# Patient Record
Sex: Female | Born: 1980 | Race: Black or African American | Hispanic: No | Marital: Single | Smoking: Never smoker
Health system: Southern US, Community
[De-identification: ages and names within clinical notes are randomized; demographics above are authoritative.]

## PROBLEM LIST (undated history)

## (undated) DIAGNOSIS — E119 Type 2 diabetes mellitus without complications: Secondary | ICD-10-CM

## (undated) DIAGNOSIS — F329 Major depressive disorder, single episode, unspecified: Secondary | ICD-10-CM

## (undated) DIAGNOSIS — F32A Depression, unspecified: Secondary | ICD-10-CM

## (undated) DIAGNOSIS — I10 Essential (primary) hypertension: Secondary | ICD-10-CM

## (undated) DIAGNOSIS — B019 Varicella without complication: Secondary | ICD-10-CM

## (undated) DIAGNOSIS — D649 Anemia, unspecified: Secondary | ICD-10-CM

## (undated) HISTORY — DX: Major depressive disorder, single episode, unspecified: F32.9

## (undated) HISTORY — DX: Anemia, unspecified: D64.9

## (undated) HISTORY — DX: Varicella without complication: B01.9

## (undated) HISTORY — DX: Type 2 diabetes mellitus without complications: E11.9

## (undated) HISTORY — DX: Depression, unspecified: F32.A

## (undated) HISTORY — DX: Essential (primary) hypertension: I10

---

## 2003-09-25 HISTORY — PX: OTHER SURGICAL HISTORY: SHX169

## 2003-09-25 HISTORY — PX: CHOLECYSTECTOMY: SHX55

## 2010-08-15 ENCOUNTER — Emergency Department (HOSPITAL_COMMUNITY): Admission: EM | Admit: 2010-08-15 | Discharge: 2010-08-15 | Payer: Self-pay | Admitting: Emergency Medicine

## 2010-11-01 ENCOUNTER — Inpatient Hospital Stay (INDEPENDENT_AMBULATORY_CARE_PROVIDER_SITE_OTHER)
Admission: RE | Admit: 2010-11-01 | Discharge: 2010-11-01 | Disposition: A | Discharge status: Home / Self Care | Payer: BC Managed Care – PPO | Source: Ambulatory Visit | Attending: Family Medicine | Admitting: Family Medicine

## 2010-11-01 DIAGNOSIS — F4320 Adjustment disorder, unspecified: Secondary | ICD-10-CM

## 2010-12-05 LAB — D-DIMER, QUANTITATIVE: D-Dimer, Quant: 0.33 ug/mL-FEU (ref 0.00–0.48)

## 2010-12-05 LAB — CBC
Hemoglobin: 8.6 g/dL — ABNORMAL LOW (ref 12.0–15.0)
MCHC: 28.3 g/dL — ABNORMAL LOW (ref 30.0–36.0)
RDW: 18.8 % — ABNORMAL HIGH (ref 11.5–15.5)

## 2010-12-05 LAB — BASIC METABOLIC PANEL
Calcium: 8.7 mg/dL (ref 8.4–10.5)
GFR calc non Af Amer: >60 mL/min (ref >60)
Glucose, Bld: 155 mg/dL — ABNORMAL HIGH (ref 70–99)
Sodium: 136 mEq/L (ref 135–145)

## 2010-12-05 LAB — DIFFERENTIAL
Eosinophils Absolute: 0.2 10*3/uL (ref 0.0–0.7)
Monocytes Absolute: 0.6 10*3/uL (ref 0.1–1.0)
Neutrophils Relative %: 62 % (ref 43–77)
Smear Review: INCREASED

## 2011-03-22 ENCOUNTER — Emergency Department: Payer: Self-pay | Admitting: Emergency Medicine

## 2011-04-04 ENCOUNTER — Emergency Department: Payer: Self-pay

## 2011-05-13 ENCOUNTER — Emergency Department: Payer: Self-pay | Admitting: *Deleted

## 2011-08-26 ENCOUNTER — Encounter: Payer: Self-pay | Admitting: *Deleted

## 2011-08-26 ENCOUNTER — Emergency Department (HOSPITAL_COMMUNITY)
Admission: EM | Admit: 2011-08-26 | Discharge: 2011-08-26 | Disposition: A | Discharge status: Home / Self Care | Payer: BC Managed Care – PPO | Attending: Emergency Medicine | Admitting: Emergency Medicine

## 2011-08-26 DIAGNOSIS — M545 Low back pain, unspecified: Secondary | ICD-10-CM | POA: Insufficient documentation

## 2011-08-26 DIAGNOSIS — R109 Unspecified abdominal pain: Secondary | ICD-10-CM | POA: Insufficient documentation

## 2011-08-26 DIAGNOSIS — R079 Chest pain, unspecified: Secondary | ICD-10-CM | POA: Insufficient documentation

## 2011-08-26 DIAGNOSIS — K219 Gastro-esophageal reflux disease without esophagitis: Secondary | ICD-10-CM | POA: Insufficient documentation

## 2011-08-26 LAB — URINALYSIS, ROUTINE W REFLEX MICROSCOPIC
Bilirubin Urine: NEGATIVE
Ketones, ur: NEGATIVE mg/dL
Nitrite: NEGATIVE
Protein, ur: NEGATIVE mg/dL
Urobilinogen, UA: 0.2 mg/dL (ref 0.0–1.0)

## 2011-08-26 LAB — POCT I-STAT, CHEM 8
Creatinine, Ser: 0.7 mg/dL (ref 0.50–1.10)
Glucose, Bld: 111 mg/dL — ABNORMAL HIGH (ref 70–99)
Hemoglobin: 10.9 g/dL — ABNORMAL LOW (ref 12.0–15.0)
TCO2: 25 mmol/L (ref 0–100)

## 2011-08-26 MED ORDER — GI COCKTAIL ~~LOC~~: 30.0000 mL | Freq: Once | ORAL | Status: DC

## 2011-08-26 MED ORDER — KETOROLAC TROMETHAMINE 30 MG/ML IJ SOLN
30.0000 mg | Freq: Once | INTRAMUSCULAR | Status: AC
  Administered 2011-08-26: 30 mg via INTRAMUSCULAR
  Filled 2011-08-26: qty 1

## 2011-08-26 NOTE — ED Provider Notes (Signed)
History     CSN: [id number] Arrival date & time: 08/26/2011  5:34 AM   None     Chief Complaint  Patient presents with  . Chest Pain    Pounding pain on sternum and sharp shooting over kidneys to back.    (Consider location/radiation/quality/duration/timing/severity/associated sxs/prior treatment) HPI  History reviewed. No pertinent past medical history.  Past Surgical History  Procedure Date  . Cholecystectomy     History reviewed. No pertinent family history.  History  Substance Use Topics  . Smoking status: Not on file  . Smokeless tobacco: Not on file  . Alcohol Use:     OB History    Grav Para Term Preterm Abortions TAB SAB Ect Mult Living                  Review of Systems  Allergies  Review of patient's allergies indicates no known allergies.  Home Medications   Current Outpatient Rx  Name Route Sig Dispense Refill  . FEXOFENADINE HCL 180 MG PO TABS Oral Take 180 mg by mouth daily.      . IRON PO Oral Take 1 tablet by mouth daily.      Marland Kitchen VALACYCLOVIR HCL 500 MG PO TABS Oral Take 500 mg by mouth daily.        BP 121/71  Pulse 56  Temp(Src) 98.2 F (36.8 C) (Oral)  Resp 12  SpO2 99%  LMP 08/02/2011  Physical Exam  ED Course  Procedures (including critical care time)  Labs Reviewed - No data to display No results found.   No diagnosis found.    MDM  Not my patient.         Doug Sou, MD 08/26/11 [phone]

## 2011-08-26 NOTE — ED Provider Notes (Signed)
History     CSN: [id number] Arrival date & time: 08/26/2011  5:34 AM   First MD Initiated Contact with Patient 08/26/11 0606      Chief Complaint  Patient presents with  . Chest Pain    Pounding pain on sternum and sharp shooting over kidneys to back.    (Consider location/radiation/quality/duration/timing/severity/associated sxs/prior treatment) HPI  Patient presents to ER complaining of acute onset left lower back and flank pain stating that she attempted to go to sleep this morning at 2am when she had acute onset pain in left lower back/flank that radiated up through flank and axilla stating at the same time she began to have a "pounding" in her chest. Patient states the symptoms would "come in waves" every few minutes with recurrent pounding sensation in chest with pain but denies CP or SOB outside of pain in back/flank. Patient denies tobacco, alcohol or illicit drug use, dysuria, hematuria, abdominal pain, vaginal d/c, n/v/d. Patient denies injury to lower back or radiation of pain into lower extremities. Patient is G1P0A1 with LMP November 8th, 2012. Denies aggravating or alleviating factors of symptoms. Symptoms were acute onset, intermittent and persistent  History reviewed. No pertinent past medical history.  Past Surgical History  Procedure Date  . Cholecystectomy     History reviewed. No pertinent family history.  History  Substance Use Topics  . Smoking status: Not on file  . Smokeless tobacco: Not on file  . Alcohol Use:     OB History    Grav Para Term Preterm Abortions TAB SAB Ect Mult Living                  Review of Systems  All other systems reviewed and are negative.    Allergies  Review of patient's allergies indicates no known allergies.  Home Medications   Current Outpatient Rx  Name Route Sig Dispense Refill  . FEXOFENADINE HCL 180 MG PO TABS Oral Take 180 mg by mouth daily.      . IRON PO Oral Take 1 tablet by mouth daily.      Marland Kitchen  VALACYCLOVIR HCL 500 MG PO TABS Oral Take 500 mg by mouth daily.        BP 121/71  Pulse 56  Temp(Src) 98.2 F (36.8 C) (Oral)  Resp 12  SpO2 99%  LMP 08/02/2011  Physical Exam  Nursing note and vitals reviewed. Constitutional: She is oriented to person, place, and time. She appears well-developed and well-nourished. No distress.  HENT:  Head: Normocephalic and atraumatic.  Eyes: Conjunctivae and EOM are normal. Pupils are equal, round, and reactive to light.  Neck: Normal range of motion. Neck supple.  Cardiovascular: Normal rate, regular rhythm, normal heart sounds and intact distal pulses.  Exam reveals no gallop and no friction rub.   No murmur heard. Pulmonary/Chest: Effort normal and breath sounds normal. No respiratory distress. She has no wheezes. She has no rales. She exhibits no tenderness.  Abdominal: Soft. Bowel sounds are normal. She exhibits no distension and no mass. There is no tenderness. There is no rebound and no guarding.  Musculoskeletal: Normal range of motion. She exhibits tenderness. She exhibits no edema.       Tenderness to palpation of left lower soft tissue of lumbar paraspinal region but no spinal tenderness to palpation. No skin changes or crepitus.  Neurological: She is alert and oriented to person, place, and time.  Skin: Skin is warm and dry. No rash noted. She is not diaphoretic.  No erythema.  Psychiatric: She has a normal mood and affect.    ED Course  Procedures (including critical care time)  Labs Reviewed  POCT I-STAT, CHEM 8 - Abnormal; Notable for the following:    BUN 5 (*)    Glucose, Bld 111 (*)    Hemoglobin 10.9 (*)    HCT 32.0 (*)    All other components within normal limits  POCT PREGNANCY, URINE  URINALYSIS, ROUTINE W REFLEX MICROSCOPIC  POCT PREGNANCY, URINE  I-STAT, CHEM 8   No results found.   1. GERD (gastroesophageal reflux disease)       MDM  Patient with pain from left lower back up into the axilla and chest  not consistent with worrisome signs or symptoms of acute coronary symptoms. No acute findings on urinalysis suggests infection or kidney stone. Patient states pain is improved. Tenderness to palpation of soft tissue of back questioning musculoskeletal source of pain versus pounding in chest being acid reflux or GERD. Patient is afebrile and nontoxic-appearing. Patient is low-risk for PE and perked negative.  Medical screening examination/treatment/procedure(s) were performed by non-physician practitioner and as supervising physician I was immediately available for consultation/collaboration. Osvaldo Human, M.D.       Jenness [place], [place] 08/26/11 1251  Carleene Cooper III, MD 08/27/11 [phone]

## 2011-08-26 NOTE — ED Provider Notes (Signed)
7:40 AM  Date: 08/26/2011  Rate: 67  Rhythm: normal sinus rhythm  QRS Axis: normal  Intervals: normal PQRS:  Left atrial abnormality  ST/T Wave abnormalities: normal  Conduction Disutrbances:none  Narrative Interpretation: Borderline EKG  Old EKG Reviewed: unchanged    Sarah Cooper III, MD 08/26/11 [phone]

## 2011-08-26 NOTE — ED Notes (Signed)
Patient presents stating about 2am she started with epigastric pain that travels down to her lower abd  And then travels up to her head.    Denies N/V, diaphoresis

## 2012-07-25 ENCOUNTER — Emergency Department: Payer: Self-pay | Admitting: Emergency Medicine

## 2012-10-15 ENCOUNTER — Encounter: Payer: Self-pay | Admitting: Family Medicine

## 2012-11-22 LAB — HM DIABETES EYE EXAM

## 2012-12-16 ENCOUNTER — Encounter: Payer: Self-pay | Admitting: Family Medicine

## 2012-12-16 ENCOUNTER — Ambulatory Visit (INDEPENDENT_AMBULATORY_CARE_PROVIDER_SITE_OTHER): Payer: BC Managed Care – PPO | Admitting: Family Medicine

## 2012-12-16 VITALS — BP 128/80 | HR 70 | Temp 98.3°F | Ht 64.75 in | Wt 356.2 lb

## 2012-12-16 DIAGNOSIS — M25511 Pain in right shoulder: Secondary | ICD-10-CM | POA: Insufficient documentation

## 2012-12-16 DIAGNOSIS — M25519 Pain in unspecified shoulder: Secondary | ICD-10-CM

## 2012-12-16 DIAGNOSIS — E119 Type 2 diabetes mellitus without complications: Secondary | ICD-10-CM | POA: Insufficient documentation

## 2012-12-16 DIAGNOSIS — R7309 Other abnormal glucose: Secondary | ICD-10-CM

## 2012-12-16 DIAGNOSIS — R0602 Shortness of breath: Secondary | ICD-10-CM

## 2012-12-16 DIAGNOSIS — R7303 Prediabetes: Secondary | ICD-10-CM

## 2012-12-16 LAB — CBC WITH DIFFERENTIAL/PLATELET
Basophils Relative: 0.2 % (ref 0.0–3.0)
Eosinophils Absolute: 0.1 10*3/uL (ref 0.0–0.7)
MCHC: 31.5 g/dL (ref 30.0–36.0)
MCV: 65.8 fl — ABNORMAL LOW (ref 78.0–100.0)
Monocytes Absolute: 0.5 10*3/uL (ref 0.1–1.0)
Neutrophils Relative %: 57.7 % (ref 43.0–77.0)
Platelets: 488 10*3/uL — ABNORMAL HIGH (ref 150.0–400.0)
RBC: 4.74 Mil/uL (ref 3.87–5.11)

## 2012-12-16 LAB — BASIC METABOLIC PANEL
BUN: 9 mg/dL (ref 6–23)
Calcium: 8.8 mg/dL (ref 8.4–10.5)
GFR: 119.14 mL/min (ref >60.00)
Potassium: 3.7 mEq/L (ref 3.5–5.1)

## 2012-12-16 LAB — HEPATIC FUNCTION PANEL
AST: 12 U/L (ref 0–37)
Total Bilirubin: 0.3 mg/dL (ref 0.3–1.2)

## 2012-12-16 LAB — LIPID PANEL
Cholesterol: 88 mg/dL (ref 0–200)
HDL: 41.9 mg/dL (ref >39.00)
Triglycerides: 49 mg/dL (ref 0.0–149.0)
VLDL: 9.8 mg/dL (ref 0.0–40.0)

## 2012-12-16 LAB — TSH: TSH: 0.9 u[IU]/mL (ref 0.35–5.50)

## 2012-12-16 MED ORDER — NAPROXEN 500 MG PO TABS: 500.0000 mg | ORAL_TABLET | Freq: Two times a day (BID) | ORAL | Status: DC

## 2012-12-16 NOTE — Assessment & Plan Note (Signed)
New.  Pt aware that this is problem and w/ BMI of 59 is very serious.  Has already started to make dietary changes.  Attempting to start exercising.  Reviewed importance of easing into exercise rather than overdoing it- to prevent injury, SOB, dizziness.  Refer to nutrition.  Check labs to risk stratify.  Will follow closely.

## 2012-12-16 NOTE — Patient Instructions (Signed)
Follow up in 3 months to recheck symptoms- sooner if needed We'll notify you of your lab results and make any changes if needed Ease into exercise- goal is to go 2-5 minutes past when it gets hard.  45 minutes each day of activity is recommended but doesn't have to come at same time Congrats on the healthy food choices!!! Someone will call you with your sports med appt Start the Naproxen twice daily- take w/ food OTC Prilosec for heartburn or shortness of breath Call with any questions or concerns Welcome!  We're glad to have you!!!

## 2012-12-16 NOTE — Assessment & Plan Note (Signed)
New.  Pt w/ some rotator cuff sxs w/out obvious evidence of tear.  Will refer to sports med for complete evaluation and home exercise plan.  Start scheduled NSAIDs.  Pt expressed understanding and is in agreement w/ plan.

## 2012-12-16 NOTE — Progress Notes (Signed)
  Subjective:    Patient ID: Sarah Faulkner, female    DOB: July 27, 1981, 32 y.o.   MRN: [id number]  HPI New to establish.  Previous MD- in [place], last seen 2 yrs ago   OB- [facility]  Pre-diabetes- dx'd 2012.  Has not had labs since dx.  Has never been on medication.  No family hx of DM.  BMI 59.  Denies polyuria, polydipsia, polyphagia.  Has recently changed diet, leaning towards pescaterian.  Since changing diet will have nausea w/ fatty, high carb foods.  Has started walking- will have some dizziness w/ exertion.  R arm pain- having pain from upper arm to base of neck.  sxs started a few months ago.  Limited ROM.  R hand dominant.  Will hurt to lie on R shoulder.  sxs initially started when pt attempted to move TV by herself.  No weakness or numbness.  Pain will start once arm gets to shoulder level.  Pain with internal rotation.  No pain w/ cross body motion.  SOB- occurs intermittently, will last 1-2 days at a time and then spontaneously resolve.  Feels like a squeezing sensation under ribs.  No associated CP.  Pt feels this might be weight related.  Not positional.  No hx of asthma.    Review of Systems For ROS see HPI     Objective:   Physical Exam  Vitals reviewed. Constitutional: She is oriented to person, place, and time. She appears well-developed and well-nourished. No distress.  Morbidly obese  HENT:  Head: Normocephalic and atraumatic.  Eyes: Conjunctivae and EOM are normal. Pupils are equal, round, and reactive to light.  Neck: Normal range of motion. Neck supple. No thyromegaly present.  Cardiovascular: Normal rate, regular rhythm, normal heart sounds and intact distal pulses.   No murmur heard. Pulmonary/Chest: Effort normal and breath sounds normal. No respiratory distress.  Abdominal: Soft. She exhibits no distension. There is no tenderness.  Musculoskeletal: She exhibits no edema.  Decreased ROM of R shoulder above 75 degrees Negative impingement signs Pain w/  abduction, forward flexion, internal rotation  Lymphadenopathy:    She has no cervical adenopathy.  Neurological: She is alert and oriented to person, place, and time.  Skin: Skin is warm and dry.  Psychiatric: She has a normal mood and affect. Her behavior is normal.          Assessment & Plan:

## 2012-12-16 NOTE — Assessment & Plan Note (Signed)
New.  Likely multifactorial- body habitus, likely esophageal spasm.  Lungs CTAB.  No red flags on hx or PE.  Will continue to follow closely.  Reviewed supportive care and red flags that should prompt return.  Pt expressed understanding and is in agreement w/ plan.

## 2012-12-16 NOTE — Assessment & Plan Note (Signed)
New to provider.  Pt was dx'd w/ this previously but has not had f/u labs.  Will check labs and start meds prn.  Discussed importance of healthy diet and regular exercise.  Will follow closely.

## 2012-12-17 MED ORDER — FERROUS SULFATE 325 (65 FE) MG PO TABS: 325.0000 mg | ORAL_TABLET | Freq: Every day | ORAL | Status: DC

## 2012-12-17 MED ORDER — METFORMIN HCL 500 MG PO TABS: 500.0000 mg | ORAL_TABLET | Freq: Two times a day (BID) | ORAL | Status: DC

## 2013-01-12 ENCOUNTER — Encounter: Payer: Self-pay | Admitting: *Deleted

## 2013-01-12 ENCOUNTER — Encounter: Payer: BC Managed Care – PPO | Attending: Family Medicine | Admitting: *Deleted

## 2013-01-12 DIAGNOSIS — Z713 Dietary counseling and surveillance: Secondary | ICD-10-CM | POA: Insufficient documentation

## 2013-01-12 DIAGNOSIS — E669 Obesity, unspecified: Secondary | ICD-10-CM | POA: Insufficient documentation

## 2013-01-12 NOTE — Patient Instructions (Signed)
Goals:  1. 1 pound weight loss per week.  2. Limit carb intake to 2-3 portions at meals, 1 portion at snacks.  3. Include a good source of protein with meals (beans, fish, eggs, nuts, tofu, etc.) 4. Continue to exercise 5 days weekly (walking, aerobics classes).

## 2013-01-12 NOTE — Progress Notes (Signed)
Medical Nutrition Therapy:  Appt start time: 0800 end time:  0900.  Assessment:  Primary concern today: Obesity. Patient with BMI of 58.3, visiting today for weight loss counseling. She has made many healthy changes, including eliminating soda, eating more vegetables, and not eating past 7pm. She is also considering going vegetarian/pescatarian for health reasons. She has lost about 10 pounds over one month. She is also exercising more, going to a fitness center 2-3 days weekly (Zumba, toning classes) for about 2 hours. She is also walking more on her breaks daily. She reports that portion control is her main issue. She was also diagnosed with pre-diabetes with a HgbA1c of 7.0.   WEIGHT: 346.9 pounds  MEDICATIONS: metformin 500 mg BID   DIETARY INTAKE:   Usual eating pattern includes 3 meals and 2 snacks per day.  24-hr recall:  B ( AM): Cup of strawberries, hard boiled eggs, whole grain toast/applesauce, water/coffee (sugar and cream)  Snk ( AM): popcorn, carrots, granola bars, seseme sticks  L ( PM): Salad (tuna, dressing, croutons, bacon bits, cheese) or chicken and rice/pasta, yogurt (biggest meal), water Snk ( PM): Sometimes, same, or apple with peanut butter D ( PM): Fish, mixed vegetables, water  Snk ( PM): None, stops eating at 7 Beverages: water, coffee  Usual physical activity: Walking daily on breaks, fitness center classes 2 hours 2-3 days weekly  Estimated energy needs: 1800 calories 225 g carbohydrates 113 g protein 50 g fat  Progress Towards Goal(s):  In progress.   Nutritional Diagnosis:  [place]-3.3 Overweight/obesity As related to history of excessive energy intake and physical inactivity.  As evidenced by BMI 58.3.    Intervention:  Nutrition counseling. We discussed strategies for weight loss, including balancing nutrients (carbs, protein, fat), portion control, healthy snacks, and exercise. We also discussed carb counting to manage blood glucose.   Goals:  1. 1  pound weight loss per week.  2. Limit carb intake to 2-3 portions at meals, 1 portion at snacks.  3. Include a good source of protein with meals (beans, fish, eggs, nuts, tofu, etc.) 4. Continue to exercise 5 days weekly (walking, aerobics classes).   Handouts given during visit include:  Count your carbs booklet  Yellow portion card  Monitoring/Evaluation:  Dietary intake, exercise, and body weight in 2 month(s).

## 2013-03-16 ENCOUNTER — Encounter: Payer: BC Managed Care – PPO | Attending: Family Medicine | Admitting: *Deleted

## 2013-03-16 ENCOUNTER — Encounter: Payer: Self-pay | Admitting: *Deleted

## 2013-03-16 DIAGNOSIS — E669 Obesity, unspecified: Secondary | ICD-10-CM | POA: Insufficient documentation

## 2013-03-16 DIAGNOSIS — Z713 Dietary counseling and surveillance: Secondary | ICD-10-CM | POA: Insufficient documentation

## 2013-03-16 NOTE — Progress Notes (Signed)
Medical Nutrition Therapy:  Appt start time: 0800 end time:  0830.  Assessment:  Patient here today for a follow up visit for weight management. She has been successful with weight loss, losing 12.3 pounds in 8 weeks (1.5 pounds per week). She reports that she has decreased red meat intake, drinking mostly water, and increasing fruit and vegetable intake. She is also monitoring carb intake (2-3 servings per meal) and continuing to exercise 5 days a week. She states that she is trying to monitor portion size, but still struggles with knowing what a proper portion looks like. She eats mainly 3 meals daily, not eating past 7, but keeps fruit (plums, strawberries, blueberries), seseme sticks, or other healthy snacks around if she is hungry.   MEDICATIONS: Metformin 500 mg BID   DIETARY INTAKE:   Usual eating pattern includes 3 meals and 1 snacks per day.  24-hr recall:  B ( AM): Strawberries, hard boiled egg, whole grain toast, water or coffee  Snk ( AM): None usually (fruit, seseme sticks)  L ( PM): Salad, chicken and rice/pasta, yogurt, water Snk ( PM): None usually, see above D ( PM): Fish/chicken/ground [place], mixed vegetables/beans, rice, water Snk ( PM): None Beverages: Water, coffee  Usual physical activity: Fitness center 3 days weekly for 1-2 hours (Zumba, strength, toning class), at home, core exercises, jumping jacks, cardio video 2 days weekly for 30-45 minutes  Estimated energy needs: 1800 calories 225 g carbohydrates 113 g protein 50 g fat  Progress Towards Goal(s):  Some progress.   Nutritional Diagnosis:  -3.3 Overweight/obesity As related to history of excessive energy intake and physical inactivity.  As evidenced by BMI 56.1.    Intervention:  Nutrition counseling. Reinforced strategies for weight loss, focusing mainly on portion size. Patient praised for current efforts, and encouraged to continue with current habits.   Goals:  1. 10-15 pound weight loss in 2 months  (Goal weight: 218-223 pounds).  2. Continue to limit carb intake to 2-3 portions at meals, 1 portion at snacks (may go up to 4 servings if not snacking).  3. Continue to monitor portion size, using measuring cups/plate method.  4. Continue to exercise 5 days weekly for at least 30-60 minutes.    Monitoring/Evaluation:  Dietary intake, exercise, and body weight prn.

## 2013-03-18 ENCOUNTER — Encounter: Payer: Self-pay | Admitting: Family Medicine

## 2013-03-18 ENCOUNTER — Ambulatory Visit (INDEPENDENT_AMBULATORY_CARE_PROVIDER_SITE_OTHER): Payer: BC Managed Care – PPO | Admitting: Family Medicine

## 2013-03-18 VITALS — BP 138/68 | HR 78 | Temp 98.5°F | Ht 64.75 in | Wt 338.8 lb

## 2013-03-18 DIAGNOSIS — R7309 Other abnormal glucose: Secondary | ICD-10-CM

## 2013-03-18 DIAGNOSIS — R7303 Prediabetes: Secondary | ICD-10-CM

## 2013-03-18 MED ORDER — METFORMIN HCL 500 MG PO TABS: 500.0000 mg | ORAL_TABLET | Freq: Two times a day (BID) | ORAL | Status: DC

## 2013-03-18 NOTE — Progress Notes (Signed)
  Subjective:    Patient ID: Sarah Faulkner, female    DOB: 03-29-1981, 32 y.o.   MRN: [id number]  HPI Diabetes- new dx at last visit.  Has lost 20 lbs since last visit.  Walking regularly, avoiding late night eating, has changed diet- less carbs.  On Metformin.  No CP, SOB, HAs, visual changes, edema, N/V/D, abd pain.   Review of Systems For ROS see HPI     Objective:   Physical Exam  Vitals reviewed. Constitutional: She is oriented to person, place, and time. She appears well-developed and well-nourished. No distress.  HENT:  Head: Normocephalic and atraumatic.  Eyes: Conjunctivae and EOM are normal. Pupils are equal, round, and reactive to light.  Neck: Normal range of motion. Neck supple. No thyromegaly present.  Cardiovascular: Normal rate, regular rhythm, normal heart sounds and intact distal pulses.   No murmur heard. Pulmonary/Chest: Effort normal and breath sounds normal. No respiratory distress.  Abdominal: Soft. She exhibits no distension. There is no tenderness.  Musculoskeletal: She exhibits no edema.  Lymphadenopathy:    She has no cervical adenopathy.  Neurological: She is alert and oriented to person, place, and time.  Skin: Skin is warm and dry.  Psychiatric: She has a normal mood and affect. Her behavior is normal.          Assessment & Plan:

## 2013-03-18 NOTE — Patient Instructions (Addendum)
Schedule a follow up appt in 3-4 months to recheck diabetes We'll notify you of your lab results and make any changes if needed Keep up the good work!  You're doing great! Call with any questions or concerns Have a great summer!

## 2013-03-18 NOTE — Assessment & Plan Note (Signed)
New dx at last visit.  Tolerating metformin w/out difficulty.  Has lost 20 lbs since last OV!!  Applauded her efforts.  Check labs.  Adjust meds prn.  Will continue to follow closely.

## 2013-03-19 LAB — BASIC METABOLIC PANEL
BUN: 8 mg/dL (ref 6–23)
Calcium: 9.1 mg/dL (ref 8.4–10.5)
Creatinine, Ser: 0.8 mg/dL (ref 0.4–1.2)
GFR: 111.84 mL/min (ref >60.00)
Glucose, Bld: 129 mg/dL — ABNORMAL HIGH (ref 70–99)
Sodium: 135 mEq/L (ref 135–145)

## 2013-04-08 ENCOUNTER — Other Ambulatory Visit: Payer: Self-pay | Admitting: Family Medicine

## 2013-04-10 NOTE — Telephone Encounter (Signed)
Refill done.  

## 2013-06-25 ENCOUNTER — Ambulatory Visit (INDEPENDENT_AMBULATORY_CARE_PROVIDER_SITE_OTHER): Payer: BC Managed Care – PPO | Admitting: Family Medicine

## 2013-06-25 VITALS — BP 130/78 | HR 86 | Temp 98.1°F | Resp 16 | Wt 336.1 lb

## 2013-06-25 DIAGNOSIS — Z23 Encounter for immunization: Secondary | ICD-10-CM

## 2013-06-25 DIAGNOSIS — R6884 Jaw pain: Secondary | ICD-10-CM | POA: Insufficient documentation

## 2013-06-25 DIAGNOSIS — E119 Type 2 diabetes mellitus without complications: Secondary | ICD-10-CM

## 2013-06-25 MED ORDER — LISINOPRIL 5 MG PO TABS: 5.0000 mg | ORAL_TABLET | Freq: Every day | ORAL | Status: DC

## 2013-06-25 NOTE — Patient Instructions (Addendum)
Schedule your complete physical in 3-4 months We'll notify you of your lab results and make any changes if needed We'll call you with your diabetes nutrition appt Keep a log of your jaw symptoms- when it starts, how long it lasts, what makes it better or worse, etc Keep up the good work!  You can do this! Happy Fall!!!

## 2013-06-25 NOTE — Progress Notes (Signed)
  Subjective:    Patient ID: Sarah Faulkner, female    DOB: [birth year], 32 y.o.   MRN: [id number]  HPI DM- new dx this year.  On Metformin 500mg  BID.  Continues to lose weight- down another 2 lbs.  No N/V/D, abd pain, SOB, HAs, visual changes, numbness/tingling hands or feet.  Had dilated eye exam in March 2014.  Not on ACE or ARB.  Jaw pain- L lower jaw, seems to occur 1-2 weeks prior to menses.  Will have pain in center of back at same time.  Pain is intermittent for a 'couple of days'.  Denies grinding teeth.  No CP, SOB.   Review of Systems For ROS see HPI     Objective:   Physical Exam  Vitals reviewed. Constitutional: She is oriented to person, place, and time. She appears well-developed and well-nourished. No distress.  Morbidly obese  HENT:  Head: Normocephalic and atraumatic.  Mouth/Throat: Oropharynx is clear and moist.  No TTP over L TMJ, no clicking or popping w/ jaw movement  Eyes: Conjunctivae and EOM are normal. Pupils are equal, round, and reactive to light.  Neck: Normal range of motion. Neck supple. No thyromegaly present.  Cardiovascular: Normal rate, regular rhythm, normal heart sounds and intact distal pulses.   No murmur heard. Pulmonary/Chest: Effort normal and breath sounds normal. No respiratory distress.  Abdominal: Soft. She exhibits no distension. There is no tenderness.  Musculoskeletal: She exhibits no edema.  Lymphadenopathy:    She has no cervical adenopathy.  Neurological: She is alert and oriented to person, place, and time.  Skin: Skin is warm and dry.  Psychiatric: She has a normal mood and affect. Her behavior is normal.          Assessment & Plan:

## 2013-06-25 NOTE — Assessment & Plan Note (Signed)
New.  No obvious cause on PE.  Hx is not consistent w/ TMJ.  Pt feels sxs are related to menses.  Encouraged pt to chart sxs.  Will follow.

## 2013-06-25 NOTE — Assessment & Plan Note (Signed)
Pt tolerating metformin w/out difficulty.  Weight is stable.  Discussed ACE/ARB w/ pt and decided to start med for renal protection since pt is not planning on pregnancy.  Reviewed that if pt is considering pregnancy, she needs to stop med immediately.  UTD on eye exam.  Currently asymptomatic.  Pt wants more diabetic education- referral made.  Check labs.  Adjust meds prn

## 2013-06-26 LAB — BASIC METABOLIC PANEL
BUN: 8 mg/dL (ref 6–23)
CO2: 24 mEq/L (ref 19–32)
Calcium: 8.9 mg/dL (ref 8.4–10.5)
GFR: 118.74 mL/min (ref >60.00)
Glucose, Bld: 99 mg/dL (ref 70–99)

## 2013-07-23 ENCOUNTER — Other Ambulatory Visit: Payer: Self-pay | Admitting: Family Medicine

## 2013-07-23 NOTE — Telephone Encounter (Signed)
Med filled.  

## 2013-08-14 ENCOUNTER — Encounter: Payer: Self-pay | Admitting: Family Medicine

## 2013-09-10 ENCOUNTER — Other Ambulatory Visit: Payer: Self-pay | Admitting: Family Medicine

## 2013-09-11 NOTE — Telephone Encounter (Signed)
Last OV 07-24-13 ([facility]) Med filled 07-22-13 #60 with 0

## 2013-09-28 ENCOUNTER — Telehealth: Payer: Self-pay

## 2013-09-28 NOTE — Telephone Encounter (Addendum)
Left message for call back  identifiable  Medication and allergies:  Reviewed and updated  90 day supply/mail order: na Local pharmacy: [facility]   Immunizations due:  UTD  A/P:   No changes to FH or PSH or personal hx Pap--due--would like at the visit Flu vaccine--06/2013 Tdap--06/2011  To Discuss with Provider: Refill Valtrex

## 2013-09-29 ENCOUNTER — Other Ambulatory Visit (HOSPITAL_COMMUNITY)
Admission: RE | Admit: 2013-09-29 | Discharge: 2013-09-29 | Disposition: A | Discharge status: Home / Self Care | Payer: BC Managed Care – PPO | Source: Ambulatory Visit | Attending: Family Medicine | Admitting: Family Medicine

## 2013-09-29 ENCOUNTER — Encounter: Payer: Self-pay | Admitting: Family Medicine

## 2013-09-29 ENCOUNTER — Ambulatory Visit (INDEPENDENT_AMBULATORY_CARE_PROVIDER_SITE_OTHER): Payer: BC Managed Care – PPO | Admitting: Family Medicine

## 2013-09-29 VITALS — BP 124/82 | HR 86 | Temp 98.3°F | Ht 64.0 in | Wt 337.6 lb

## 2013-09-29 DIAGNOSIS — Z01419 Encounter for gynecological examination (general) (routine) without abnormal findings: Secondary | ICD-10-CM

## 2013-09-29 DIAGNOSIS — Z124 Encounter for screening for malignant neoplasm of cervix: Secondary | ICD-10-CM | POA: Insufficient documentation

## 2013-09-29 DIAGNOSIS — N760 Acute vaginitis: Secondary | ICD-10-CM | POA: Insufficient documentation

## 2013-09-29 DIAGNOSIS — Z113 Encounter for screening for infections with a predominantly sexual mode of transmission: Secondary | ICD-10-CM | POA: Insufficient documentation

## 2013-09-29 DIAGNOSIS — Z0000 Encounter for general adult medical examination without abnormal findings: Secondary | ICD-10-CM | POA: Insufficient documentation

## 2013-09-29 DIAGNOSIS — Z1151 Encounter for screening for human papillomavirus (HPV): Secondary | ICD-10-CM | POA: Insufficient documentation

## 2013-09-29 DIAGNOSIS — E119 Type 2 diabetes mellitus without complications: Secondary | ICD-10-CM

## 2013-09-29 DIAGNOSIS — Z20828 Contact with and (suspected) exposure to other viral communicable diseases: Secondary | ICD-10-CM

## 2013-09-29 LAB — HEMOGLOBIN A1C: Hgb A1c MFr Bld: 6.4 % (ref 4.6–6.5)

## 2013-09-29 LAB — CBC WITH DIFFERENTIAL/PLATELET
BASOS ABS: 0 10*3/uL (ref 0.0–0.1)
Basophils Relative: 0.1 % (ref 0.0–3.0)
EOS ABS: 0.1 10*3/uL (ref 0.0–0.7)
Eosinophils Relative: 1.4 % (ref 0.0–5.0)
HCT: 28.7 % — ABNORMAL LOW (ref 36.0–46.0)
Hemoglobin: 9.1 g/dL — ABNORMAL LOW (ref 12.0–15.0)
LYMPHS PCT: 26.6 % (ref 12.0–46.0)
Lymphs Abs: 2.1 10*3/uL (ref 0.7–4.0)
MCHC: 31.7 g/dL (ref 30.0–36.0)
MCV: 61.8 fl — AB (ref 78.0–100.0)
Monocytes Absolute: 0.4 10*3/uL (ref 0.1–1.0)
Monocytes Relative: 5.5 % (ref 3.0–12.0)
NEUTROS PCT: 66.4 % (ref 43.0–77.0)
Neutro Abs: 5.1 10*3/uL (ref 1.4–7.7)
PLATELETS: 470 10*3/uL — AB (ref 150.0–400.0)
RBC: 4.64 Mil/uL (ref 3.87–5.11)
RDW: 18.4 % — AB (ref 11.5–14.6)
WBC: 7.7 10*3/uL (ref 4.5–10.5)

## 2013-09-29 LAB — BASIC METABOLIC PANEL
BUN: 9 mg/dL (ref 6–23)
CHLORIDE: 103 meq/L (ref 96–112)
CO2: 24 mEq/L (ref 19–32)
CREATININE: 0.8 mg/dL (ref 0.4–1.2)
Calcium: 8.6 mg/dL (ref 8.4–10.5)
GFR: 113.16 mL/min (ref >60.00)
Glucose, Bld: 91 mg/dL (ref 70–99)
POTASSIUM: 3.8 meq/L (ref 3.5–5.1)
Sodium: 135 mEq/L (ref 135–145)

## 2013-09-29 LAB — LIPID PANEL
CHOLESTEROL: 103 mg/dL (ref 0–200)
HDL: 50.2 mg/dL (ref >39.00)
LDL Cholesterol: 45 mg/dL (ref 0–99)
Total CHOL/HDL Ratio: 2
Triglycerides: 40 mg/dL (ref 0.0–149.0)
VLDL: 8 mg/dL (ref 0.0–40.0)

## 2013-09-29 LAB — HEPATIC FUNCTION PANEL
ALT: 14 U/L (ref 0–35)
AST: 12 U/L (ref 0–37)
Albumin: 3.6 g/dL (ref 3.5–5.2)
Alkaline Phosphatase: 51 U/L (ref 39–117)
BILIRUBIN TOTAL: 0.4 mg/dL (ref 0.3–1.2)
Bilirubin, Direct: 0 mg/dL (ref 0.0–0.3)
Total Protein: 7.8 g/dL (ref 6.0–8.3)

## 2013-09-29 LAB — TSH: TSH: 1.69 u[IU]/mL (ref 0.35–5.50)

## 2013-09-29 MED ORDER — VALACYCLOVIR HCL 500 MG PO TABS: 500.0000 mg | ORAL_TABLET | Freq: Every day | ORAL | Status: DC

## 2013-09-29 NOTE — Assessment & Plan Note (Signed)
UTD on eye exam (March 2014), due for labs.  Adjust meds prn.

## 2013-09-29 NOTE — Assessment & Plan Note (Signed)
Pt's PE WNL w/ exception of obesity.  Check labs.  Anticipatory guidance provided.  

## 2013-09-29 NOTE — Patient Instructions (Signed)
Follow up in 3-4 months to recheck diabetes We'll notify you of your lab results and make any changes if needed Try and make low carb food choices and get regular exercise- you can do this! Call with any questions or concerns Happy New Year!!

## 2013-09-29 NOTE — Progress Notes (Signed)
   Subjective:    Patient ID: Sarah Faulkner, female    DOB: [birth year], 33 y.o.   MRN: [id number]  HPI CPE- due for pap.  No concerns.   Review of Systems Patient reports no vision/ hearing changes, adenopathy,fever, weight change,  persistant/recurrent hoarseness , swallowing issues, chest pain, palpitations, edema, persistant/recurrent cough, hemoptysis, dyspnea (rest/exertional/paroxysmal nocturnal), gastrointestinal bleeding (melena, rectal bleeding), abdominal pain, significant heartburn, bowel changes, GU symptoms (dysuria, hematuria, incontinence), Gyn symptoms (abnormal  bleeding, pain),  syncope, focal weakness, memory loss, numbness & tingling, skin/hair/nail changes, abnormal bruising or bleeding, anxiety, or depression.     Objective:   Physical Exam  General Appearance:    Alert, cooperative, no distress, appears stated age, morbidly obese  Head:    Normocephalic, without obvious abnormality, atraumatic  Eyes:    PERRL, conjunctiva/corneas clear, EOM's intact, fundi    benign, both eyes  Ears:    Normal TM's and external ear canals, both ears  Nose:   Nares normal, septum midline, mucosa normal, no drainage    or sinus tenderness  Throat:   Lips, mucosa, and tongue normal; teeth and gums normal  Neck:   Supple, symmetrical, trachea midline, no adenopathy;    Thyroid: no enlargement/tenderness/nodules  Back:     Symmetric, no curvature, ROM normal, no CVA tenderness  Lungs:     Clear to auscultation bilaterally, respirations unlabored  Chest Wall:    No tenderness or deformity   Heart:    Regular rate and rhythm, S1 and S2 normal, no murmur, rub   or gallop  Breast Exam:    No tenderness, masses, or nipple abnormality  Abdomen:     Soft, non-tender, bowel sounds active all four quadrants,    no masses, no organomegaly  Genitalia:    External genitalia normal, cervix normal in appearance, no CMT, uterus in normal size and position, adnexa w/out mass or tenderness,  mucosa pink and moist, no lesions or discharge present  Rectal:    Normal external appearance  Extremities:   Extremities normal, atraumatic, no cyanosis or edema  Pulses:   2+ and symmetric all extremities  Skin:   Skin color, texture, turgor normal, no rashes or lesions  Lymph nodes:   Cervical, supraclavicular, and axillary nodes normal  Neurologic:   CNII-XII intact, normal strength, sensation and reflexes    throughout          Assessment & Plan:

## 2013-09-29 NOTE — Assessment & Plan Note (Signed)
Pap collected. 

## 2013-09-30 ENCOUNTER — Telehealth: Payer: Self-pay

## 2013-09-30 LAB — HIV ANTIBODY (ROUTINE TESTING W REFLEX): HIV: NONREACTIVE

## 2013-09-30 LAB — RPR

## 2013-09-30 NOTE — Telephone Encounter (Signed)
Relevant patient education assigned to patient using Emmi. ° °

## 2013-10-01 LAB — VITAMIN D 1,25 DIHYDROXY
Vitamin D 1, 25 (OH)2 Total: 28 pg/mL (ref 18–72)
Vitamin D2 1, 25 (OH)2: <8 pg/mL
Vitamin D3 1, 25 (OH)2: 28 pg/mL

## 2013-10-02 ENCOUNTER — Other Ambulatory Visit: Payer: Self-pay | Admitting: General Practice

## 2013-10-02 MED ORDER — METRONIDAZOLE 500 MG PO TABS: 500.0000 mg | ORAL_TABLET | Freq: Two times a day (BID) | ORAL | Status: DC

## 2013-12-07 ENCOUNTER — Other Ambulatory Visit: Payer: Self-pay | Admitting: Family Medicine

## 2013-12-07 NOTE — Telephone Encounter (Signed)
Med filled.  

## 2014-01-12 ENCOUNTER — Ambulatory Visit: Payer: BC Managed Care – PPO | Admitting: Family Medicine

## 2014-01-18 ENCOUNTER — Ambulatory Visit: Payer: BC Managed Care – PPO | Admitting: Family Medicine

## 2014-02-08 ENCOUNTER — Ambulatory Visit: Payer: BC Managed Care – PPO | Admitting: Family Medicine

## 2014-03-10 ENCOUNTER — Ambulatory Visit (INDEPENDENT_AMBULATORY_CARE_PROVIDER_SITE_OTHER): Payer: BC Managed Care – PPO | Admitting: Family Medicine

## 2014-03-10 ENCOUNTER — Encounter: Payer: Self-pay | Admitting: Family Medicine

## 2014-03-10 VITALS — BP 126/78 | HR 72 | Temp 98.7°F | Resp 16 | Wt 337.0 lb

## 2014-03-10 DIAGNOSIS — E119 Type 2 diabetes mellitus without complications: Secondary | ICD-10-CM

## 2014-03-10 MED ORDER — FERROUS SULFATE 325 (65 FE) MG PO TABS: 325.0000 mg | ORAL_TABLET | Freq: Every day | ORAL | Status: DC

## 2014-03-10 NOTE — Assessment & Plan Note (Signed)
Pt tolerating meds w/o difficulty.  Attempting healthy diet and regular exercise.  Check labs.  Adjust meds prn

## 2014-03-10 NOTE — Assessment & Plan Note (Signed)
Chronic problem.  Weight is stable.  Pt frustrated that she's not losing weight.  Applauded her efforts at healthy diet and regular exercise.  Encouraged her to start using [employer] and to not give up.  Will follow closely.

## 2014-03-10 NOTE — Progress Notes (Signed)
Pre visit review using our clinic review tool, if applicable. No additional management support is needed unless otherwise documented below in the visit note. 

## 2014-03-10 NOTE — Progress Notes (Signed)
   Subjective:    Patient ID: Sarah Faulkner, female    DOB: [birth year], 33 y.o.   MRN: [id number]  HPI DM- chronic problem, on Metformin.  Taking Lisinopril when she remembers for renal protection.  Attempting to work out and 'eating better'.  Denies CP, SOB, HAs, visual changes, edema, N/V.  No symptomatic lows.  Obesity- chronic problem, pt is attempting to lose weight w/ diet and exercise.  Has seen nutrition.  Not interested in surgery.  Exercising 3x/week for 30-45 minutes.     Review of Systems For ROS see HPI     Objective:   Physical Exam  Vitals reviewed. Constitutional: She is oriented to person, place, and time. She appears well-developed and well-nourished. No distress.  Morbidly obese  HENT:  Head: Normocephalic and atraumatic.  Eyes: Conjunctivae and EOM are normal. Pupils are equal, round, and reactive to light.  Neck: Normal range of motion. Neck supple. No thyromegaly present.  Cardiovascular: Normal rate, regular rhythm, normal heart sounds and intact distal pulses.   No murmur heard. Pulmonary/Chest: Effort normal and breath sounds normal. No respiratory distress.  Abdominal: Soft. She exhibits no distension. There is no tenderness.  Musculoskeletal: She exhibits no edema.  Lymphadenopathy:    She has no cervical adenopathy.  Neurological: She is alert and oriented to person, place, and time.  Skin: Skin is warm and dry.  Psychiatric: She has a normal mood and affect. Her behavior is normal.          Assessment & Plan:

## 2014-03-10 NOTE — Patient Instructions (Signed)
Follow up in 3-4 months to recheck diabetes and weight loss progress We'll notify you of your lab results and make any changes if needed Keep up the good work on healthy diet and regular exercise Start using [employer]itness[employer] Goal is for 4-5 days of activity/week, for 30-60 minutes at a time Call with any questions or concerns Hang in there! You can do this!!!

## 2014-03-11 LAB — BASIC METABOLIC PANEL
BUN: 11 mg/dL (ref 6–23)
CALCIUM: 9.3 mg/dL (ref 8.4–10.5)
CHLORIDE: 104 meq/L (ref 96–112)
CO2: 27 meq/L (ref 19–32)
Creatinine, Ser: 0.8 mg/dL (ref 0.4–1.2)
GFR: 103.37 mL/min (ref >60.00)
GLUCOSE: 103 mg/dL — AB (ref 70–99)
POTASSIUM: 4.2 meq/L (ref 3.5–5.1)
SODIUM: 136 meq/L (ref 135–145)

## 2014-03-11 LAB — HEMOGLOBIN A1C: HEMOGLOBIN A1C: 6.5 % (ref 4.6–6.5)

## 2014-04-13 ENCOUNTER — Other Ambulatory Visit: Payer: Self-pay | Admitting: Family Medicine

## 2014-04-13 NOTE — Telephone Encounter (Signed)
Med filled.  

## 2014-04-29 ENCOUNTER — Encounter: Payer: Self-pay | Admitting: Family Medicine

## 2014-04-29 ENCOUNTER — Ambulatory Visit (INDEPENDENT_AMBULATORY_CARE_PROVIDER_SITE_OTHER): Payer: BC Managed Care – PPO | Admitting: Family Medicine

## 2014-04-29 VITALS — BP 120/74 | HR 76 | Temp 98.4°F | Resp 16 | Wt 340.4 lb

## 2014-04-29 DIAGNOSIS — IMO0001 Reserved for inherently not codable concepts without codable children: Secondary | ICD-10-CM | POA: Insufficient documentation

## 2014-04-29 DIAGNOSIS — Z30011 Encounter for initial prescription of contraceptive pills: Secondary | ICD-10-CM

## 2014-04-29 DIAGNOSIS — Z3009 Encounter for other general counseling and advice on contraception: Secondary | ICD-10-CM

## 2014-04-29 MED ORDER — NORGESTIMATE-ETH ESTRADIOL 0.25-35 MG-MCG PO TABS: 1.0000 | ORAL_TABLET | Freq: Every day | ORAL | Status: DC

## 2014-04-29 NOTE — Progress Notes (Signed)
   Subjective:    Patient ID: Sarah Faulkner, female    DOB: [birth year], 33 y.o.   MRN: [id number]  HPI Contraception- pt interested in birth control.  Previously on Ortho-Tri-Cyclen for menorrhagia.  Not currently sexually active.  Pt is more interested in the ability to control her cycle than actually preventing pregnancy.   Review of Systems For ROS see HPI     Objective:   Physical Exam  Vitals reviewed. Constitutional: She is oriented to person, place, and time. She appears well-developed and well-nourished. No distress.  Neurological: She is alert and oriented to person, place, and time.  Skin: Skin is warm and dry.  Psychiatric: She has a normal mood and affect. Her behavior is normal. Thought content normal.          Assessment & Plan:

## 2014-04-29 NOTE — Patient Instructions (Signed)
Follow up as scheduled Start the pills the Sunday after you start your cycle Take the pills daily as directed around the same time Call with any questions or concerns ENJOY YOUR TRIP!!!

## 2014-04-29 NOTE — Assessment & Plan Note (Signed)
New.  Pt interested in being able to control her periods.  Discussed various options- pt elected to start OCPs.  Reviewed appropriate start date and regular use.  Pt expressed understanding and is in agreement w/ plan.

## 2014-04-29 NOTE — Progress Notes (Signed)
Pre visit review using our clinic review tool, if applicable. No additional management support is needed unless otherwise documented below in the visit note. 

## 2014-07-12 ENCOUNTER — Ambulatory Visit: Payer: BC Managed Care – PPO | Admitting: Family Medicine

## 2014-07-29 ENCOUNTER — Ambulatory Visit (INDEPENDENT_AMBULATORY_CARE_PROVIDER_SITE_OTHER): Payer: BC Managed Care – PPO | Admitting: Family Medicine

## 2014-07-29 ENCOUNTER — Encounter: Payer: Self-pay | Admitting: Family Medicine

## 2014-07-29 ENCOUNTER — Other Ambulatory Visit: Payer: Self-pay | Admitting: Family Medicine

## 2014-07-29 VITALS — BP 130/76 | HR 108 | Temp 98.2°F | Resp 16 | Wt 336.2 lb

## 2014-07-29 DIAGNOSIS — N912 Amenorrhea, unspecified: Secondary | ICD-10-CM

## 2014-07-29 DIAGNOSIS — R110 Nausea: Secondary | ICD-10-CM | POA: Insufficient documentation

## 2014-07-29 DIAGNOSIS — E119 Type 2 diabetes mellitus without complications: Secondary | ICD-10-CM

## 2014-07-29 DIAGNOSIS — R102 Pelvic and perineal pain: Secondary | ICD-10-CM

## 2014-07-29 DIAGNOSIS — R824 Acetonuria: Secondary | ICD-10-CM

## 2014-07-29 DIAGNOSIS — R1031 Right lower quadrant pain: Secondary | ICD-10-CM

## 2014-07-29 LAB — POCT URINALYSIS DIPSTICK
BILIRUBIN UA: NEGATIVE
Blood, UA: NEGATIVE
Glucose, UA: NEGATIVE
KETONES UA: 0.5
LEUKOCYTES UA: NEGATIVE
Nitrite, UA: NEGATIVE
PROTEIN UA: 0.15
Spec Grav, UA: >=1.03
Urobilinogen, UA: >=8
pH, UA: 6

## 2014-07-29 LAB — HEPATIC FUNCTION PANEL
ALK PHOS: 53 U/L (ref 39–117)
ALT: 13 U/L (ref 0–35)
AST: 15 U/L (ref 0–37)
Albumin: 3 g/dL — ABNORMAL LOW (ref 3.5–5.2)
BILIRUBIN DIRECT: 0 mg/dL (ref 0.0–0.3)
BILIRUBIN TOTAL: 0.2 mg/dL (ref 0.2–1.2)
Total Protein: 7.8 g/dL (ref 6.0–8.3)

## 2014-07-29 LAB — CBC WITH DIFFERENTIAL/PLATELET
Basophils Absolute: 0 10*3/uL (ref 0.0–0.1)
Basophils Relative: 0.4 % (ref 0.0–3.0)
EOS ABS: 0.1 10*3/uL (ref 0.0–0.7)
EOS PCT: 0.9 % (ref 0.0–5.0)
HEMATOCRIT: 30.5 % — AB (ref 36.0–46.0)
Hemoglobin: 9.3 g/dL — ABNORMAL LOW (ref 12.0–15.0)
LYMPHS ABS: 1.8 10*3/uL (ref 0.7–4.0)
Lymphocytes Relative: 21.8 % (ref 12.0–46.0)
MCHC: 30.3 g/dL (ref 30.0–36.0)
MONO ABS: 0.6 10*3/uL (ref 0.1–1.0)
Monocytes Relative: 7.4 % (ref 3.0–12.0)
NEUTROS PCT: 69.5 % (ref 43.0–77.0)
Neutro Abs: 5.6 10*3/uL (ref 1.4–7.7)
PLATELETS: 457 10*3/uL — AB (ref 150.0–400.0)
RBC: 4.76 Mil/uL (ref 3.87–5.11)
RDW: 20.8 % — AB (ref 11.5–15.5)
WBC: 8.1 10*3/uL (ref 4.0–10.5)

## 2014-07-29 LAB — HCG, QUANTITATIVE, PREGNANCY: QUANTITATIVE HCG: 1.06 m[IU]/mL

## 2014-07-29 LAB — BASIC METABOLIC PANEL
BUN: 9 mg/dL (ref 6–23)
CO2: 22 mEq/L (ref 19–32)
Calcium: 9 mg/dL (ref 8.4–10.5)
Chloride: 106 mEq/L (ref 96–112)
Creatinine, Ser: 0.8 mg/dL (ref 0.4–1.2)
GFR: 112.58 mL/min (ref >60.00)
Glucose, Bld: 101 mg/dL — ABNORMAL HIGH (ref 70–99)
Potassium: 3.8 mEq/L (ref 3.5–5.1)
SODIUM: 138 meq/L (ref 135–145)

## 2014-07-29 LAB — HEMOGLOBIN A1C: HEMOGLOBIN A1C: 6.2 % (ref 4.6–6.5)

## 2014-07-29 LAB — POCT URINE PREGNANCY: PREG TEST UR: NEGATIVE

## 2014-07-29 MED ORDER — ONDANSETRON HCL 4 MG PO TABS: 4.0000 mg | ORAL_TABLET | Freq: Three times a day (TID) | ORAL | Status: DC | PRN

## 2014-07-29 NOTE — Assessment & Plan Note (Signed)
New.  R sided pelvic pain.  No rebound or guarding.  Despite negative u preg, get serum HCG to r/o pregnancy.  Get [place] to assess for ovarian cyst or other abnormality.  Reviewed supportive care and red flags that should prompt return.  Pt expressed understanding and is in agreement w/ plan.

## 2014-07-29 NOTE — Progress Notes (Signed)
Pre visit review using our clinic review tool, if applicable. No additional management support is needed unless otherwise documented below in the visit note. 

## 2014-07-29 NOTE — Patient Instructions (Signed)
Follow up as needed We'll notify you of your lab results and get your appt for the ultrasound Drink plenty of fluids Zofran as needed for nausea REST! Call with any questions or concerns Hang in there!!

## 2014-07-29 NOTE — Progress Notes (Signed)
   Subjective:    Patient ID: Sarah Faulkner, female    DOB: 1980/11/15, 33 y.o.   MRN: [id number]  HPI Nausea- sxs started 2 weeks ago.  Pt reports certain foods are 'making me ill'.  Having R pelvic pain that will radiate to back.  No breast tenderness.  LMP- 10/16, 'but it wasn't a normal one.  It was shorter and lighter'.  Taking OCPs.  No change in medications.  Pt had unprotected sex.  DM- ongoing issue for pt, on Metformin.  + nausea but this just started recently.  Prior to 2 weeks, pt was doing fine w/ metformin.  Has lost 4 lbs!  On Lisinopril.  No CP, SOB, HAs, visual changes, edema.   Review of Systems For ROS see HPI     Objective:   Physical Exam  Constitutional: She is oriented to person, place, and time. She appears well-developed and well-nourished. No distress.  HENT:  Head: Normocephalic and atraumatic.  Eyes: Conjunctivae and EOM are normal. Pupils are equal, round, and reactive to light.  Neck: Normal range of motion. Neck supple. No thyromegaly present.  Cardiovascular: Normal rate, regular rhythm, normal heart sounds and intact distal pulses.   No murmur heard. Pulmonary/Chest: Effort normal and breath sounds normal. No respiratory distress.  Abdominal: Soft. She exhibits no distension. There is tenderness (RLQ/pelvic pain). There is no rebound and no guarding.  Musculoskeletal: She exhibits no edema.  Lymphadenopathy:    She has no cervical adenopathy.  Neurological: She is alert and oriented to person, place, and time.  Skin: Skin is warm and dry.  Psychiatric: She has a normal mood and affect. Her behavior is normal.  Vitals reviewed.         Assessment & Plan:

## 2014-07-29 NOTE — Assessment & Plan Note (Signed)
New.  Check labs to r/o infxn, pregnancy.  Start zofran prn.  Reviewed supportive care and red flags that should prompt return.  Pt expressed understanding and is in agreement w/ plan.

## 2014-07-29 NOTE — Assessment & Plan Note (Signed)
Chronic problem.  Was previously tolerating metformin w/o difficulty but now w/ nausea- unclear if this is related to medication or other underlying process.  On ACE for renal protection.  Check labs.  Adjust meds prn

## 2014-07-30 ENCOUNTER — Ambulatory Visit (HOSPITAL_BASED_OUTPATIENT_CLINIC_OR_DEPARTMENT_OTHER)
Admission: RE | Admit: 2014-07-30 | Discharge: 2014-07-30 | Disposition: A | Discharge status: Home / Self Care | Payer: BC Managed Care – PPO | Source: Ambulatory Visit | Attending: Family Medicine | Admitting: Family Medicine

## 2014-07-30 ENCOUNTER — Other Ambulatory Visit: Payer: Self-pay | Admitting: Family Medicine

## 2014-07-30 ENCOUNTER — Ambulatory Visit (HOSPITAL_BASED_OUTPATIENT_CLINIC_OR_DEPARTMENT_OTHER): Payer: BC Managed Care – PPO

## 2014-07-30 DIAGNOSIS — R1900 Intra-abdominal and pelvic swelling, mass and lump, unspecified site: Secondary | ICD-10-CM | POA: Diagnosis not present

## 2014-07-30 DIAGNOSIS — D259 Leiomyoma of uterus, unspecified: Secondary | ICD-10-CM

## 2014-07-30 DIAGNOSIS — R102 Pelvic and perineal pain: Secondary | ICD-10-CM | POA: Diagnosis present

## 2014-07-31 LAB — GC/CHLAMYDIA PROBE AMP, URINE
Chlamydia, Swab/Urine, PCR: NEGATIVE
GC Probe Amp, Urine: NEGATIVE

## 2014-08-01 LAB — URINE CULTURE: Colony Count: 2000

## 2014-08-02 ENCOUNTER — Ambulatory Visit (HOSPITAL_BASED_OUTPATIENT_CLINIC_OR_DEPARTMENT_OTHER): Payer: BC Managed Care – PPO

## 2014-08-02 ENCOUNTER — Ambulatory Visit: Payer: BC Managed Care – PPO | Admitting: Family Medicine

## 2014-08-14 ENCOUNTER — Other Ambulatory Visit: Payer: Self-pay | Admitting: Family Medicine

## 2014-08-16 ENCOUNTER — Ambulatory Visit: Payer: Self-pay | Admitting: Gynecology

## 2014-08-16 NOTE — Telephone Encounter (Signed)
Med filled.  

## 2014-11-06 ENCOUNTER — Other Ambulatory Visit: Payer: Self-pay | Admitting: Family Medicine

## 2014-11-08 NOTE — Telephone Encounter (Signed)
Med filled.  

## 2014-12-14 ENCOUNTER — Other Ambulatory Visit: Payer: Self-pay | Admitting: Family Medicine

## 2014-12-15 NOTE — Telephone Encounter (Signed)
Med filled.  

## 2015-04-12 ENCOUNTER — Other Ambulatory Visit: Payer: Self-pay | Admitting: Family Medicine

## 2015-04-12 NOTE — Telephone Encounter (Signed)
Med filled #30 only, pt is overdue for a diabetes follow up.

## 2015-04-27 ENCOUNTER — Other Ambulatory Visit: Payer: Self-pay | Admitting: Family Medicine

## 2015-04-27 NOTE — Telephone Encounter (Signed)
Medication filled to pharmacy as requested.   

## 2015-06-22 ENCOUNTER — Other Ambulatory Visit: Payer: Self-pay | Admitting: Family Medicine

## 2015-06-22 NOTE — Telephone Encounter (Signed)
Med filled #30 . No further refills without an appt.

## 2015-07-15 ENCOUNTER — Emergency Department (HOSPITAL_COMMUNITY)
Admission: EM | Admit: 2015-07-15 | Discharge: 2015-07-15 | Disposition: A | Discharge status: Home / Self Care | Payer: BLUE CROSS/BLUE SHIELD | Attending: Emergency Medicine | Admitting: Emergency Medicine

## 2015-07-15 ENCOUNTER — Ambulatory Visit (INDEPENDENT_AMBULATORY_CARE_PROVIDER_SITE_OTHER): Payer: BLUE CROSS/BLUE SHIELD | Admitting: Family Medicine

## 2015-07-15 ENCOUNTER — Encounter: Payer: Self-pay | Admitting: Family Medicine

## 2015-07-15 ENCOUNTER — Encounter (HOSPITAL_COMMUNITY): Payer: Self-pay | Admitting: *Deleted

## 2015-07-15 VITALS — BP 130/80 | HR 89 | Temp 98.1°F | Resp 16 | Ht 64.0 in | Wt 374.4 lb

## 2015-07-15 DIAGNOSIS — Z79899 Other long term (current) drug therapy: Secondary | ICD-10-CM | POA: Insufficient documentation

## 2015-07-15 DIAGNOSIS — M5410 Radiculopathy, site unspecified: Secondary | ICD-10-CM

## 2015-07-15 DIAGNOSIS — Z8659 Personal history of other mental and behavioral disorders: Secondary | ICD-10-CM | POA: Insufficient documentation

## 2015-07-15 DIAGNOSIS — Z23 Encounter for immunization: Secondary | ICD-10-CM | POA: Diagnosis not present

## 2015-07-15 DIAGNOSIS — Y9389 Activity, other specified: Secondary | ICD-10-CM | POA: Diagnosis not present

## 2015-07-15 DIAGNOSIS — Y998 Other external cause status: Secondary | ICD-10-CM | POA: Diagnosis not present

## 2015-07-15 DIAGNOSIS — E119 Type 2 diabetes mellitus without complications: Secondary | ICD-10-CM | POA: Insufficient documentation

## 2015-07-15 DIAGNOSIS — W1839XA Other fall on same level, initial encounter: Secondary | ICD-10-CM | POA: Diagnosis not present

## 2015-07-15 DIAGNOSIS — Z8619 Personal history of other infectious and parasitic diseases: Secondary | ICD-10-CM | POA: Insufficient documentation

## 2015-07-15 DIAGNOSIS — Y92009 Unspecified place in unspecified non-institutional (private) residence as the place of occurrence of the external cause: Secondary | ICD-10-CM | POA: Insufficient documentation

## 2015-07-15 DIAGNOSIS — S3992XA Unspecified injury of lower back, initial encounter: Secondary | ICD-10-CM | POA: Insufficient documentation

## 2015-07-15 DIAGNOSIS — M5442 Lumbago with sciatica, left side: Secondary | ICD-10-CM

## 2015-07-15 LAB — BASIC METABOLIC PANEL
BUN: 8 mg/dL (ref 7–25)
CHLORIDE: 104 mmol/L (ref 98–110)
CO2: 25 mmol/L (ref 20–31)
Calcium: 8.9 mg/dL (ref 8.6–10.2)
Creat: 0.65 mg/dL (ref 0.50–1.10)
GLUCOSE: 116 mg/dL — AB (ref 65–99)
POTASSIUM: 4.6 mmol/L (ref 3.5–5.3)
Sodium: 137 mmol/L (ref 135–146)

## 2015-07-15 LAB — CBC WITH DIFFERENTIAL/PLATELET
Basophils Absolute: 0 10*3/uL (ref 0.0–0.1)
Basophils Relative: 0 % (ref 0–1)
Eosinophils Absolute: 0.1 10*3/uL (ref 0.0–0.7)
Eosinophils Relative: 1 % (ref 0–5)
HCT: 26.6 % — ABNORMAL LOW (ref 36.0–46.0)
Hemoglobin: 7.9 g/dL — ABNORMAL LOW (ref 12.0–15.0)
Lymphocytes Relative: 29 % (ref 12–46)
Lymphs Abs: 2.5 10*3/uL (ref 0.7–4.0)
MCH: 17.8 pg — ABNORMAL LOW (ref 26.0–34.0)
MCHC: 29.7 g/dL — ABNORMAL LOW (ref 30.0–36.0)
MCV: 60 fL — ABNORMAL LOW (ref 78.0–100.0)
MPV: 8.5 fL — ABNORMAL LOW (ref 8.6–12.4)
Monocytes Absolute: 0.4 10*3/uL (ref 0.1–1.0)
Monocytes Relative: 5 % (ref 3–12)
Neutro Abs: 5.6 10*3/uL (ref 1.7–7.7)
Neutrophils Relative %: 65 % (ref 43–77)
Platelets: 657 10*3/uL — ABNORMAL HIGH (ref 150–400)
RBC: 4.43 MIL/uL (ref 3.87–5.11)
RDW: 17.9 % — ABNORMAL HIGH (ref 11.5–15.5)
WBC: 8.6 10*3/uL (ref 4.0–10.5)

## 2015-07-15 LAB — HEPATIC FUNCTION PANEL
ALT: 12 U/L (ref 6–29)
AST: 9 U/L — ABNORMAL LOW (ref 10–30)
Albumin: 3.4 g/dL — ABNORMAL LOW (ref 3.6–5.1)
Alkaline Phosphatase: 56 U/L (ref 33–115)
Bilirubin, Direct: 0.1 mg/dL (ref <=0.2)
Indirect Bilirubin: 0.3 mg/dL (ref 0.2–1.2)
Total Bilirubin: 0.4 mg/dL (ref 0.2–1.2)
Total Protein: 7.5 g/dL (ref 6.1–8.1)

## 2015-07-15 LAB — LIPID PANEL
CHOLESTEROL: 80 mg/dL — AB (ref 125–200)
HDL: 36 mg/dL — ABNORMAL LOW (ref >=46)
LDL Cholesterol: 38 mg/dL (ref <130)
Total CHOL/HDL Ratio: 2.2 Ratio (ref <=5.0)
Triglycerides: 30 mg/dL (ref <150)
VLDL: 6 mg/dL (ref <30)

## 2015-07-15 LAB — HEMOGLOBIN A1C
Hgb A1c MFr Bld: 7.6 % — ABNORMAL HIGH (ref <5.7)
MEAN PLASMA GLUCOSE: 171 mg/dL — AB (ref <117)

## 2015-07-15 LAB — TSH: TSH: 1.727 u[IU]/mL (ref 0.350–4.500)

## 2015-07-15 MED ORDER — HYDROCODONE-ACETAMINOPHEN 5-325 MG PO TABS
1.0000 | ORAL_TABLET | ORAL | Status: DC | PRN
Start: 2015-07-15 — End: 2016-09-03

## 2015-07-15 MED ORDER — NAPROXEN 500 MG PO TABS: 500.0000 mg | ORAL_TABLET | Freq: Two times a day (BID) | ORAL | Status: DC

## 2015-07-15 MED ORDER — CYCLOBENZAPRINE HCL 10 MG PO TABS: 10.0000 mg | ORAL_TABLET | Freq: Three times a day (TID) | ORAL | Status: DC | PRN

## 2015-07-15 NOTE — Assessment & Plan Note (Signed)
Chronic problem.  Pt has not been compliant in her follow up.  Taking metformin w/o difficulty.  Pt has gained 40 lbs since last visit.  Stressed need for healthy diet and regular exercise.  Due for eye exam- pt plans to schedule.  On ACE for renal protection.  Check labs.  Adjust meds prn

## 2015-07-15 NOTE — Progress Notes (Signed)
   Subjective:    Patient ID: Sarah Faulkner, female    DOB: 1981-09-10, 34 y.o.   MRN: [id number]  HPI DM- chronic problem, on Metformin twice daily but 'i haven't been taking it regularly'.  On ACE for renal protection.  Due for eye exam.  No numbness/tingling of hands/feet.  No CP, SOB, HAs, visual changes, edema, symptomatic lows, abd pain, N/V.  Morbid obesity- pt has gained 40 lbs in last year.  Not exercising regularly or following particular diet.    Back pain- pt went to ER earlier today for back pain.  Pt states that she slept on floor 2 nights ago and had some pain and stiffness.  This AM, she was getting dressed and 'back went out' and she was unable to get up w/o falling.  Had to call 911 and she went to ER for evaluation and tx.  Pt is now able to walk- 'very very gingerly'.  Pain is radiating down L leg.  Pt was started on prescription strength NSAIDs and hydrocodone.   Review of Systems For ROS see HPI     Objective:   Physical Exam  Constitutional: She is oriented to person, place, and time. She appears well-developed and well-nourished. No distress.  Morbidly obese  HENT:  Head: Normocephalic and atraumatic.  Eyes: Conjunctivae and EOM are normal. Pupils are equal, round, and reactive to light.  Neck: Normal range of motion. Neck supple. No thyromegaly present.  Cardiovascular: Normal rate, regular rhythm, normal heart sounds and intact distal pulses.   No murmur heard. Pulmonary/Chest: Effort normal and breath sounds normal. No respiratory distress.  Abdominal: Soft. She exhibits no distension. There is no tenderness.  Musculoskeletal: She exhibits no edema.  + SLR on L  Lymphadenopathy:    She has no cervical adenopathy.  Neurological: She is alert and oriented to person, place, and time. No cranial nerve deficit.  Skin: Skin is warm and dry.  Psychiatric: She has a normal mood and affect. Her behavior is normal.  Vitals reviewed.         Assessment &  Plan:

## 2015-07-15 NOTE — ED Provider Notes (Signed)
CSN: [id number]     Arrival date & time 07/15/15  0913 History   First MD Initiated Contact with Patient 07/15/15 [phone]     Chief Complaint  Patient presents with  . Back Pain  . Fall     (Consider location/radiation/quality/duration/timing/severity/associated sxs/prior Treatment) Patient is a 34 y.o. female presenting with back pain and fall.  Back Pain Location:  Gluteal region Quality:  Shooting and stabbing Radiates to:  L posterior upper leg and L foot Pain severity:  Moderate Pain is:  Unable to specify Onset quality:  Gradual Duration:  1 day Timing:  Intermittent Progression:  Improving Chronicity:  New Context comment:  Laying in floor of apartment 2 nights ago Relieved by: fentanyl. Worsened by:  Nothing tried Associated symptoms: no abdominal pain, no bladder incontinence, no bowel incontinence and no fever   Risk factors: obesity   Fall Pertinent negatives include no abdominal pain.    Past Medical History  Diagnosis Date  . Chicken pox   . Depression   . Diabetes mellitus without complication [facility])    Past Surgical History  Procedure Laterality Date  . Cholecystectomy    . Gallbladder removed  2005   Family History  Problem Relation Age of Onset  . Cancer Mother     breast  . Heart disease Maternal Grandmother   . Stroke Maternal Grandmother   . Hypertension Maternal Grandmother    Social History  Substance Use Topics  . Smoking status: Never Smoker   . Smokeless tobacco: Never Used  . Alcohol Use: No   OB History    No data available     Review of Systems  Constitutional: Negative for fever.  Gastrointestinal: Negative for abdominal pain and bowel incontinence.  Genitourinary: Negative for bladder incontinence.  Musculoskeletal: Positive for back pain.  All other systems reviewed and are negative.     Allergies  Review of patient's allergies indicates no known allergies.  Home Medications   Prior to Admission medications    Medication Sig Start Date End Date Taking? Authorizing Provider  Cholecalciferol (D3-1000) 1000 UNITS tablet Take 2,000 Units by mouth daily.   Yes Historical Provider, MD  ferrous sulfate 325 (65 FE) MG tablet Take 1 tablet (325 mg total) by mouth daily with breakfast. 03/10/14  Yes Midge Minium, MD  lisinopril (PRINIVIL,ZESTRIL) 5 MG tablet TAKE 1 TABLET BY MOUTH EVERY DAY 06/22/15  Yes Midge Minium, MD  metFORMIN (GLUCOPHAGE) 500 MG tablet TAKE 1 TABLET BY MOUTH TWICE DAILY WITH MEALS 04/27/15  Yes Midge Minium, MD  valACYclovir (VALTREX) 500 MG tablet TAKE 1 TABLET BY MOUTH EVERY DAY Patient taking differently: TAKE 1 TABLET BY MOUTH EVERY DAY as needed for flare ups 12/15/14  Yes Midge Minium, MD  MONONESSA 0.25-35 MG-MCG tablet TAKE 1 TABLET BY MOUTH EVERY DAY Patient not taking: Reported on 07/15/2015 04/12/15   Midge Minium, MD   BP 169/88 mmHg  Pulse 102  Temp(Src) 97.8 F (36.6 C) (Oral)  Resp 20  SpO2 98%  LMP 07/07/2015 Physical Exam  Constitutional: She is oriented to person, place, and time. She appears well-developed and well-nourished. No distress.  HENT:  Head: Normocephalic.  Eyes: Conjunctivae are normal.  Neck: Neck supple. No tracheal deviation present.  Cardiovascular: Normal rate, regular rhythm and normal heart sounds.   Pulmonary/Chest: Effort normal and breath sounds normal. No respiratory distress.  Abdominal: Soft. She exhibits no distension.  Musculoskeletal:  Positive left leg raise  Neurological: She is  alert and oriented to person, place, and time.  Skin: Skin is warm and dry.  Psychiatric: She has a normal mood and affect.    ED Course  Procedures (including critical care time) Labs Review Labs Reviewed - No data to display  Imaging Review No results found. I have personally reviewed and evaluated these images and lab results as part of my medical decision-making.   EKG Interpretation None      MDM   Final  diagnoses:  Left-sided low back pain with left-sided sciatica    34 year old female presents with typical sciatica symptoms on the left side after sleeping on the floor 2 nights previously. Today her pain was so great that it prevented her from ambulating. She was given 200 g of fentanyl in route with improvement of her symptoms. No red flag symptoms of fever, weight loss, saddle anesthesia, weakness fecal/urinary incontinence or urinary retention. Short-term narcotic analgesia supplied and recommended scheduled NSAIDs and early mobility as definitive management. Do not feel that steroid course is indicated secondary to underlying diabetes. Plan to follow up with PCP as needed and return precautions discussed for worsening or new concerning symptoms.     Leo Grosser, MD 07/16/15 [phone]

## 2015-07-15 NOTE — Patient Instructions (Signed)
Follow up in 3 months to recheck diabetes and weight loss progress We'll notify you of your lab results and make any changes if needed Try and work on healthy diet and regular exercise- you can do it! Schedule your eye exam! Add the flexeril as needed for back pain/spasm- will cause drowsiness so best before bed Call with any questions or concerns If you want to join [place] at the new [place] office, any scheduled appointments will automatically transfer and we will see you at [address] [place] [address] [place], [place],  [address]  Hang in there!!!

## 2015-07-15 NOTE — Assessment & Plan Note (Signed)
Chronic problem for pt.  She has gained 40 lbs in the last year.  Pt is not exercising nor following a low carb diet- stressed the need for both.  Check labs to risk stratify.  Will follow closely.

## 2015-07-15 NOTE — Discharge Instructions (Signed)

## 2015-07-15 NOTE — Progress Notes (Signed)
Pre visit review using our clinic review tool, if applicable. No additional management support is needed unless otherwise documented below in the visit note. 

## 2015-07-15 NOTE — ED Notes (Signed)
Patient is complaining of back pain this morning that shoots down the left leg. Patient fell at home this morning due to this pain. She states her leg gave out. She crawled to let EMS in this morning. She was given 267mcg of fentanly IV by EMS.

## 2015-07-15 NOTE — Assessment & Plan Note (Signed)
New.  Pt was seen in ER earlier today and given scheduled NSAIDs, pain meds.  Will add Flexeril for relief before bed.  Reviewed supportive care and red flags that should prompt return.  Pt expressed understanding and is in agreement w/ plan.

## 2015-07-15 NOTE — ED Notes (Signed)
Bed: AI90 Expected date:  Expected time:  Means of arrival:  Comments: EMS F sciatica

## 2015-07-18 ENCOUNTER — Telehealth: Payer: Self-pay | Admitting: General Practice

## 2015-07-18 NOTE — Telephone Encounter (Signed)
FYI, pt has appt with [facility] on 10/27 is that ok?

## 2015-07-18 NOTE — Telephone Encounter (Signed)
That should be fine as she is asymptomatic.

## 2015-07-18 NOTE — Telephone Encounter (Signed)
-----   Message from Oneta Rack sent at 07/18/2015  1:31 PM EDT ----- Regarding: lab results  Contact: [phone] Menses have been abnormal and no blood in stool or urine, patient scheduled for 07/21/15 with Dr. Birdie Riddle. best [phone]

## 2015-07-19 ENCOUNTER — Other Ambulatory Visit: Payer: Self-pay | Admitting: General Practice

## 2015-07-19 DIAGNOSIS — E119 Type 2 diabetes mellitus without complications: Secondary | ICD-10-CM

## 2015-07-19 DIAGNOSIS — D649 Anemia, unspecified: Secondary | ICD-10-CM

## 2015-07-19 MED ORDER — METFORMIN HCL 1000 MG PO TABS: 1000.0000 mg | ORAL_TABLET | Freq: Two times a day (BID) | ORAL | Status: DC

## 2015-07-21 ENCOUNTER — Ambulatory Visit (INDEPENDENT_AMBULATORY_CARE_PROVIDER_SITE_OTHER): Payer: BLUE CROSS/BLUE SHIELD | Admitting: Family Medicine

## 2015-07-21 ENCOUNTER — Encounter: Payer: Self-pay | Admitting: Family Medicine

## 2015-07-21 VITALS — BP 126/82 | HR 76 | Temp 98.1°F | Resp 16 | Ht 64.0 in | Wt 380.1 lb

## 2015-07-21 DIAGNOSIS — N920 Excessive and frequent menstruation with regular cycle: Secondary | ICD-10-CM | POA: Diagnosis not present

## 2015-07-21 DIAGNOSIS — D509 Iron deficiency anemia, unspecified: Secondary | ICD-10-CM | POA: Diagnosis not present

## 2015-07-21 MED ORDER — FERROUS SULFATE 325 (65 FE) MG PO TABS: 325.0000 mg | ORAL_TABLET | Freq: Every day | ORAL | Status: DC

## 2015-07-21 NOTE — Patient Instructions (Signed)
Follow up as scheduled Start the daily iron supplement- add an over the counter stool softener Drink plenty of fluids We'll call you with your GYN appt Call with any questions or concerns Hang in there!!!

## 2015-07-21 NOTE — Progress Notes (Signed)
Pre visit review using our clinic review tool, if applicable. No additional management support is needed unless otherwise documented below in the visit note. 

## 2015-07-21 NOTE — Progress Notes (Signed)
   Subjective:    Patient ID: Sarah Faulkner, female    DOB: 17-Oct-1980, 34 y.o.   MRN: [id number]  HPI Anemia- pt has hx of anemia.  No hx of transfusion.  Previously on iron supplements but not recently.  Pt reports very heavy periods x2-3 days, 'out of control heavy'.  hgb 7.6 on recent labs.  No hx of GI bleeding.  No recent GYN visit- prefers female provider.  Labs done last week were right after pt finished her cycle   Review of Systems For ROS see HPI     Objective:   Physical Exam  Constitutional: She is oriented to person, place, and time. She appears well-developed and well-nourished. No distress.  Morbidly obese  HENT:  Head: Normocephalic and atraumatic.  Eyes: EOM are normal. Pupils are equal, round, and reactive to light.  Pale conjunctiva  Cardiovascular: Normal rate, regular rhythm and normal heart sounds.   Pulmonary/Chest: Effort normal and breath sounds normal. No respiratory distress. She has no wheezes. She has no rales.  Neurological: She is alert and oriented to person, place, and time.  Skin: Skin is warm and dry.  Psychiatric: She has a normal mood and affect. Her behavior is normal. Thought content normal.  Vitals reviewed.         Assessment & Plan:

## 2015-07-24 NOTE — Assessment & Plan Note (Signed)
New.  Pt reports very heavy menstrual periods.  Has not had recent GYN evaluation.  Referral placed.

## 2015-07-24 NOTE — Assessment & Plan Note (Signed)
Deteriorated based on recent labs.  Restart iron supplement.  Refer to GYN for evaluation and tx of menorrhagia.  Pt expressed understanding and is in agreement w/ plan.

## 2015-08-11 LAB — HM DIABETES EYE EXAM

## 2015-09-06 ENCOUNTER — Encounter: Payer: Self-pay | Admitting: Family Medicine

## 2015-09-08 ENCOUNTER — Encounter: Payer: Self-pay | Admitting: General Practice

## 2015-10-14 ENCOUNTER — Ambulatory Visit: Payer: BLUE CROSS/BLUE SHIELD | Admitting: Family Medicine

## 2016-01-16 ENCOUNTER — Encounter: Payer: BLUE CROSS/BLUE SHIELD | Admitting: Family Medicine

## 2016-01-17 ENCOUNTER — Telehealth: Payer: Self-pay | Admitting: Family Medicine

## 2016-01-17 NOTE — Telephone Encounter (Signed)
No charge. 

## 2016-01-17 NOTE — Telephone Encounter (Signed)
Pt was no show 01/16/16 1:30pm, per Meredith Mody pt came to [facility] office, she is rescheduled for June, charge or no charge?

## 2016-02-08 ENCOUNTER — Other Ambulatory Visit: Payer: Self-pay | Admitting: Family Medicine

## 2016-02-08 NOTE — Telephone Encounter (Signed)
Med filled, pt has a CPE next month.

## 2016-03-22 ENCOUNTER — Other Ambulatory Visit (INDEPENDENT_AMBULATORY_CARE_PROVIDER_SITE_OTHER): Payer: BLUE CROSS/BLUE SHIELD

## 2016-03-22 ENCOUNTER — Encounter: Payer: Self-pay | Admitting: Family Medicine

## 2016-03-22 ENCOUNTER — Ambulatory Visit (INDEPENDENT_AMBULATORY_CARE_PROVIDER_SITE_OTHER): Payer: BLUE CROSS/BLUE SHIELD | Admitting: Family Medicine

## 2016-03-22 ENCOUNTER — Other Ambulatory Visit: Payer: Self-pay | Admitting: General Practice

## 2016-03-22 VITALS — BP 126/84 | HR 86 | Temp 98.3°F | Resp 16 | Ht 64.0 in | Wt 372.0 lb

## 2016-03-22 DIAGNOSIS — E119 Type 2 diabetes mellitus without complications: Secondary | ICD-10-CM

## 2016-03-22 DIAGNOSIS — Z202 Contact with and (suspected) exposure to infections with a predominantly sexual mode of transmission: Secondary | ICD-10-CM | POA: Diagnosis not present

## 2016-03-22 DIAGNOSIS — N920 Excessive and frequent menstruation with regular cycle: Secondary | ICD-10-CM

## 2016-03-22 DIAGNOSIS — Z0000 Encounter for general adult medical examination without abnormal findings: Secondary | ICD-10-CM

## 2016-03-22 LAB — CBC WITH DIFFERENTIAL/PLATELET
BASOS PCT: 0.3 % (ref 0.0–3.0)
Basophils Absolute: 0 10*3/uL (ref 0.0–0.1)
EOS PCT: 1.8 % (ref 0.0–5.0)
Eosinophils Absolute: 0.2 10*3/uL (ref 0.0–0.7)
LYMPHS PCT: 25.4 % (ref 12.0–46.0)
Lymphs Abs: 2.3 10*3/uL (ref 0.7–4.0)
MCHC: 29.3 g/dL — AB (ref 30.0–36.0)
MCV: 54 fl — ABNORMAL LOW (ref 78.0–100.0)
Monocytes Absolute: 0.6 10*3/uL (ref 0.1–1.0)
Monocytes Relative: 6.2 % (ref 3.0–12.0)
NEUTROS ABS: 6 10*3/uL (ref 1.4–7.7)
Neutrophils Relative %: 66.3 % (ref 43.0–77.0)
PLATELETS: 690 10*3/uL — AB (ref 150.0–400.0)
RBC: 4.68 Mil/uL (ref 3.87–5.11)
RDW: 21 % — AB (ref 11.5–15.5)
WBC: 9.1 10*3/uL (ref 4.0–10.5)

## 2016-03-22 LAB — LIPID PANEL
CHOL/HDL RATIO: 2
CHOLESTEROL: 89 mg/dL (ref 0–200)
HDL: 39.7 mg/dL (ref >39.00)
LDL CALC: 43 mg/dL (ref 0–99)
NonHDL: 49.62
TRIGLYCERIDES: 33 mg/dL (ref 0.0–149.0)
VLDL: 6.6 mg/dL (ref 0.0–40.0)

## 2016-03-22 LAB — HEPATIC FUNCTION PANEL
ALT: 12 U/L (ref 0–35)
AST: 7 U/L (ref 0–37)
Albumin: 3.7 g/dL (ref 3.5–5.2)
Alkaline Phosphatase: 52 U/L (ref 39–117)
BILIRUBIN DIRECT: 0 mg/dL (ref 0.0–0.3)
BILIRUBIN TOTAL: 0.4 mg/dL (ref 0.2–1.2)
Total Protein: 8.2 g/dL (ref 6.0–8.3)

## 2016-03-22 LAB — BASIC METABOLIC PANEL
BUN: 10 mg/dL (ref 6–23)
CHLORIDE: 102 meq/L (ref 96–112)
CO2: 28 meq/L (ref 19–32)
Calcium: 9.3 mg/dL (ref 8.4–10.5)
Creatinine, Ser: 0.74 mg/dL (ref 0.40–1.20)
GFR: 114.96 mL/min (ref >60.00)
Glucose, Bld: 136 mg/dL — ABNORMAL HIGH (ref 70–99)
Potassium: 4.3 mEq/L (ref 3.5–5.1)
SODIUM: 137 meq/L (ref 135–145)

## 2016-03-22 LAB — HEMOGLOBIN A1C: HEMOGLOBIN A1C: 7.1 % — AB (ref 4.6–6.5)

## 2016-03-22 LAB — VITAMIN D 25 HYDROXY (VIT D DEFICIENCY, FRACTURES): VITD: 28.39 ng/mL — ABNORMAL LOW (ref 30.00–100.00)

## 2016-03-22 LAB — TSH: TSH: 2.45 u[IU]/mL (ref 0.35–4.50)

## 2016-03-22 NOTE — Telephone Encounter (Signed)
Pt continues to have low Hgb- is she having heavy periods?  I need her to start prescription FeSO4 325mg  daily (along w/ a stool softener).  If the periods are heavy, she needs GYN referral.  If periods are not heavy, we need Heme referral

## 2016-03-22 NOTE — Telephone Encounter (Signed)
Our office received a call on critical labs for the patient. Hemoglobin- 7.4 and hematocrit of 25.3

## 2016-03-22 NOTE — Assessment & Plan Note (Signed)
Chronic problem.  Tolerating metformin.  On ACE for renal protection.  UTD on foot exam, eye exam.  Check labs.  Adjust meds prn

## 2016-03-22 NOTE — Progress Notes (Signed)
Pre visit review using our clinic review tool, if applicable. No additional management support is needed unless otherwise documented below in the visit note. 

## 2016-03-22 NOTE — Progress Notes (Signed)
   Subjective:    Patient ID: Sarah Faulkner, female    DOB: 1980-12-01, 35 y.o.   MRN: [id number]  HPI CPE- UTD on pap (due 2018).  UTD on foot exam, eye exam.  Obesity- ongoing issue for pt.  She reports she has been dieting but hasn't seen much change in her weight.  Has lost 8 lbs.  Pt has seen nutrition previously and feels she knows what she should be doing.  Is walking regularly.  Review of Systems Patient reports no vision/ hearing changes, adenopathy,fever, weight change,  persistant/recurrent hoarseness , swallowing issues, chest pain, palpitations, edema, persistant/recurrent cough, hemoptysis, dyspnea (rest/exertional/paroxysmal nocturnal), gastrointestinal bleeding (melena, rectal bleeding), abdominal pain, significant heartburn, bowel changes, GU symptoms (dysuria, hematuria, incontinence), Gyn symptoms (abnormal  bleeding, pain),  syncope, focal weakness, memory loss, numbness & tingling, skin/hair/nail changes, abnormal bruising or bleeding, anxiety, or depression.     Objective:   Physical Exam General Appearance:    Alert, cooperative, no distress, appears stated age, obese  Head:    Normocephalic, without obvious abnormality, atraumatic  Eyes:    PERRL, conjunctiva/corneas clear, EOM's intact, fundi    benign, both eyes  Ears:    Normal TM's and external ear canals, both ears  Nose:   Nares normal, septum midline, mucosa normal, no drainage    or sinus tenderness  Throat:   Lips, mucosa, and tongue normal; teeth and gums normal  Neck:   Supple, symmetrical, trachea midline, no adenopathy;    Thyroid: no enlargement/tenderness/nodules  Back:     Symmetric, no curvature, ROM normal, no CVA tenderness  Lungs:     Clear to auscultation bilaterally, respirations unlabored  Chest Wall:    No tenderness or deformity   Heart:    Regular rate and rhythm, S1 and S2 normal, no murmur, rub   or gallop  Breast Exam:    Deferred  Abdomen:     Soft, non-tender, bowel sounds active  all four quadrants,    no masses, no organomegaly  Genitalia:    Deferred  Rectal:    Extremities:   Extremities normal, atraumatic, no cyanosis or edema  Pulses:   2+ and symmetric all extremities  Skin:   Skin color, texture, turgor normal, no rashes or lesions  Lymph nodes:   Cervical, supraclavicular, and axillary nodes normal  Neurologic:   CNII-XII intact, normal strength, sensation and reflexes    throughout          Assessment & Plan:

## 2016-03-22 NOTE — Assessment & Plan Note (Signed)
Ongoing issue for pt.  Applauded her recent 8 lb weight loss.  Stressed need for healthy diet and regular exercise.  Will follow.

## 2016-03-22 NOTE — Telephone Encounter (Signed)
Called patient and LMOVM to return call.     

## 2016-03-22 NOTE — Assessment & Plan Note (Signed)
Pt's PE WNL w/ exception of obesity.  UTD on pap, Tdap.  Stressed need for healthy diet and regular exercise.  Check labs.  Anticipatory guidance provided.

## 2016-03-22 NOTE — Patient Instructions (Signed)
Follow up in 3-4 months to recheck diabetes Go to the [place] office to get your labs done- [place] Continue to work on [employer] and regular exercise- you're doing it!!! Call with any questions or concerns Thanks for sticking with [place]!!! Happy 4th!!!

## 2016-03-23 LAB — GC/CHLAMYDIA PROBE AMP
CT PROBE, AMP APTIMA: NOT DETECTED
GC Probe RNA: NOT DETECTED

## 2016-03-23 LAB — RPR

## 2016-03-23 LAB — HIV ANTIBODY (ROUTINE TESTING W REFLEX): HIV: NONREACTIVE

## 2016-03-23 MED ORDER — FERROUS SULFATE 325 (65 FE) MG PO TABS: 325.0000 mg | ORAL_TABLET | Freq: Every day | ORAL | Status: DC

## 2016-03-23 NOTE — Addendum Note (Signed)
Addended by: Davis Gourd on: 03/23/2016 09:22 AM   Modules accepted: Orders

## 2016-03-23 NOTE — Telephone Encounter (Signed)
Spoke with patient, she advised that she is having heavy periods. Referral to Gyn placed and pt made aware. Do you want me to send in the iron supplement?

## 2016-03-24 LAB — HM PAP SMEAR

## 2016-04-09 DIAGNOSIS — R109 Unspecified abdominal pain: Secondary | ICD-10-CM | POA: Diagnosis not present

## 2016-04-09 DIAGNOSIS — Z3009 Encounter for other general counseling and advice on contraception: Secondary | ICD-10-CM | POA: Diagnosis not present

## 2016-04-09 DIAGNOSIS — N939 Abnormal uterine and vaginal bleeding, unspecified: Secondary | ICD-10-CM | POA: Diagnosis not present

## 2016-04-09 DIAGNOSIS — D509 Iron deficiency anemia, unspecified: Secondary | ICD-10-CM | POA: Diagnosis not present

## 2016-04-16 DIAGNOSIS — D649 Anemia, unspecified: Secondary | ICD-10-CM | POA: Diagnosis not present

## 2016-04-16 DIAGNOSIS — N924 Excessive bleeding in the premenopausal period: Secondary | ICD-10-CM | POA: Diagnosis not present

## 2016-05-06 ENCOUNTER — Other Ambulatory Visit: Payer: Self-pay | Admitting: Family Medicine

## 2016-05-09 DIAGNOSIS — D259 Leiomyoma of uterus, unspecified: Secondary | ICD-10-CM | POA: Diagnosis not present

## 2016-05-09 DIAGNOSIS — D649 Anemia, unspecified: Secondary | ICD-10-CM | POA: Diagnosis not present

## 2016-06-25 DIAGNOSIS — N939 Abnormal uterine and vaginal bleeding, unspecified: Secondary | ICD-10-CM | POA: Diagnosis not present

## 2016-06-25 DIAGNOSIS — D649 Anemia, unspecified: Secondary | ICD-10-CM | POA: Diagnosis not present

## 2016-07-02 ENCOUNTER — Ambulatory Visit: Payer: BLUE CROSS/BLUE SHIELD | Admitting: Family Medicine

## 2016-08-27 ENCOUNTER — Ambulatory Visit: Payer: Self-pay | Admitting: Family Medicine

## 2016-09-03 ENCOUNTER — Ambulatory Visit (INDEPENDENT_AMBULATORY_CARE_PROVIDER_SITE_OTHER): Payer: BLUE CROSS/BLUE SHIELD | Admitting: Family Medicine

## 2016-09-03 ENCOUNTER — Encounter: Payer: Self-pay | Admitting: Family Medicine

## 2016-09-03 VITALS — BP 130/86 | HR 85 | Temp 98.0°F | Resp 16 | Ht 64.0 in | Wt 373.4 lb

## 2016-09-03 DIAGNOSIS — R030 Elevated blood-pressure reading, without diagnosis of hypertension: Secondary | ICD-10-CM | POA: Diagnosis not present

## 2016-09-03 DIAGNOSIS — E119 Type 2 diabetes mellitus without complications: Secondary | ICD-10-CM | POA: Diagnosis not present

## 2016-09-03 LAB — CBC WITH DIFFERENTIAL/PLATELET
Basophils Absolute: 0.1 10*3/uL (ref 0.0–0.1)
Basophils Relative: 0.6 % (ref 0.0–3.0)
EOS PCT: 1.9 % (ref 0.0–5.0)
Eosinophils Absolute: 0.2 10*3/uL (ref 0.0–0.7)
HEMATOCRIT: 32.9 % — AB (ref 36.0–46.0)
Hemoglobin: 10.1 g/dL — ABNORMAL LOW (ref 12.0–15.0)
LYMPHS ABS: 2.8 10*3/uL (ref 0.7–4.0)
LYMPHS PCT: 31.4 % (ref 12.0–46.0)
MCHC: 30.6 g/dL (ref 30.0–36.0)
MONOS PCT: 6.1 % (ref 3.0–12.0)
Monocytes Absolute: 0.5 10*3/uL (ref 0.1–1.0)
NEUTROS PCT: 60 % (ref 43.0–77.0)
Neutro Abs: 5.3 10*3/uL (ref 1.4–7.7)
Platelets: 522 10*3/uL — ABNORMAL HIGH (ref 150.0–400.0)
RBC: 5.27 Mil/uL — AB (ref 3.87–5.11)
RDW: 19.4 % — ABNORMAL HIGH (ref 11.5–15.5)
WBC: 8.9 10*3/uL (ref 4.0–10.5)

## 2016-09-03 LAB — TSH: TSH: 2.17 u[IU]/mL (ref 0.35–4.50)

## 2016-09-03 LAB — BASIC METABOLIC PANEL
BUN: 7 mg/dL (ref 6–23)
CHLORIDE: 100 meq/L (ref 96–112)
CO2: 28 mEq/L (ref 19–32)
Calcium: 9.2 mg/dL (ref 8.4–10.5)
Creatinine, Ser: 0.74 mg/dL (ref 0.40–1.20)
GFR: 114.66 mL/min (ref >60.00)
GLUCOSE: 128 mg/dL — AB (ref 70–99)
POTASSIUM: 4.4 meq/L (ref 3.5–5.1)
SODIUM: 136 meq/L (ref 135–145)

## 2016-09-03 LAB — HEPATIC FUNCTION PANEL
ALBUMIN: 3.8 g/dL (ref 3.5–5.2)
ALK PHOS: 60 U/L (ref 39–117)
ALT: 23 U/L (ref 0–35)
AST: 21 U/L (ref 0–37)
Bilirubin, Direct: 0.1 mg/dL (ref 0.0–0.3)
TOTAL PROTEIN: 7.8 g/dL (ref 6.0–8.3)
Total Bilirubin: 0.3 mg/dL (ref 0.2–1.2)

## 2016-09-03 LAB — LIPID PANEL
CHOLESTEROL: 88 mg/dL (ref 0–200)
HDL: 42 mg/dL (ref >39.00)
LDL Cholesterol: 37 mg/dL (ref 0–99)
NONHDL: 45.68
Total CHOL/HDL Ratio: 2
Triglycerides: 44 mg/dL (ref 0.0–149.0)
VLDL: 8.8 mg/dL (ref 0.0–40.0)

## 2016-09-03 LAB — MICROALBUMIN / CREATININE URINE RATIO
CREATININE, U: 293.8 mg/dL
MICROALB UR: 12.2 mg/dL — AB (ref 0.0–1.9)
MICROALB/CREAT RATIO: 4.2 mg/g (ref 0.0–30.0)

## 2016-09-03 LAB — HEMOGLOBIN A1C: Hgb A1c MFr Bld: 7.9 % — ABNORMAL HIGH (ref 4.6–6.5)

## 2016-09-03 MED ORDER — LORCASERIN HCL 10 MG PO TABS: 1.0000 | ORAL_TABLET | Freq: Two times a day (BID) | ORAL | 3 refills | Status: DC

## 2016-09-03 NOTE — Progress Notes (Signed)
Pre visit review using our clinic review tool, if applicable. No additional management support is needed unless otherwise documented below in the visit note. 

## 2016-09-03 NOTE — Assessment & Plan Note (Signed)
Ongoing issue for pt.  She is exercising twice weekly.  Encouraged her to increase this to 3-4x/week.  Pt did some research and is interested in starting Belviq to assist w/ weight loss.  Discussed need for healthy diet and regular exercise in addition to medication.  Will follow.

## 2016-09-03 NOTE — Assessment & Plan Note (Signed)
Ongoing issue.  Not high enough to require medication.  Stressed need for healthy diet and regular exercise.  Will follow and start meds prn.

## 2016-09-03 NOTE — Progress Notes (Signed)
   Subjective:    Patient ID: Sarah Faulkner, female    DOB: 06-Nov-1980, 35 y.o.   MRN: [id number]  HPI Elevated BP- chronic problem, stopped her Lisinopril which was used for both BP and renal protection in setting of diabetes.  Pt reports the medication made her extremely sleepy.  DM- chronic problem, on Metformin twice daily.  Due for foot exam and eye exam (plans to schedule).  No CP, SOB, HAs, visual changes, symptomatic lows, numbness/tingling of hands/feet.  Obesity- chronic problem.  Pt's BMI is 64.  Pt is considering starting Belviq.  Pt did her own research and Belviq is her preference.  Pt reports she is 'trying to exercise'.  Will walk on treadmill 2x/week for 30 minutes.     Review of Systems For ROS see HPI     Objective:   Physical Exam  Constitutional: She is oriented to person, place, and time. She appears well-developed and well-nourished. No distress.  Morbid obesity  HENT:  Head: Normocephalic and atraumatic.  Eyes: Conjunctivae and EOM are normal. Pupils are equal, round, and reactive to light.  Neck: Normal range of motion. Neck supple. No thyromegaly present.  Cardiovascular: Normal rate, regular rhythm, normal heart sounds and intact distal pulses.   No murmur heard. Pulmonary/Chest: Effort normal and breath sounds normal. No respiratory distress.  Abdominal: Soft. She exhibits no distension. There is no tenderness.  Musculoskeletal: She exhibits no edema.  Lymphadenopathy:    She has no cervical adenopathy.  Neurological: She is alert and oriented to person, place, and time.  Skin: Skin is warm and dry.  Psychiatric: She has a normal mood and affect. Her behavior is normal.  Vitals reviewed.         Assessment & Plan:

## 2016-09-03 NOTE — Assessment & Plan Note (Signed)
Chronic problem.  Tolerating Metformin w/o difficulty.  Stopped her Lisinopril due to fatigue.  Due for eye exam- pt to schedule.  Foot exam done today.  Check Microalbumin- restart ACE/ARB prn.  Stressed need for healthy diet and regular exercise.  Check labs.  Adjust meds prn

## 2016-09-03 NOTE — Patient Instructions (Signed)
Follow up in 6-8 weeks to recheck weight loss progress on Belviq We'll notify you of your lab results and make any changes if needed Continue to work on healthy diet and regular exercise- goal is 3-4x/week for 30+ minutes... But any activity is better than no activity! Please schedule your eye exam! Call with any questions or concerns Happy Holidays! Enjoy [place]!!

## 2016-09-07 ENCOUNTER — Other Ambulatory Visit: Payer: Self-pay | Admitting: General Practice

## 2016-09-07 NOTE — Telephone Encounter (Signed)
PA for belviq was began in covemymeds today and approved. Approval faxed to pharmacy and also sent to scanning.

## 2016-09-27 ENCOUNTER — Ambulatory Visit (INDEPENDENT_AMBULATORY_CARE_PROVIDER_SITE_OTHER): Payer: BLUE CROSS/BLUE SHIELD | Admitting: Physician Assistant

## 2016-09-27 ENCOUNTER — Encounter: Payer: Self-pay | Admitting: Physician Assistant

## 2016-09-27 VITALS — BP 130/88 | HR 71 | Temp 98.4°F | Resp 16 | Ht 64.0 in | Wt 370.0 lb

## 2016-09-27 DIAGNOSIS — J069 Acute upper respiratory infection, unspecified: Secondary | ICD-10-CM | POA: Diagnosis not present

## 2016-09-27 DIAGNOSIS — B9789 Other viral agents as the cause of diseases classified elsewhere: Secondary | ICD-10-CM

## 2016-09-27 MED ORDER — HYDROCODONE-HOMATROPINE 5-1.5 MG/5ML PO SYRP: 5.0000 mL | ORAL_SOLUTION | Freq: Three times a day (TID) | ORAL | 0 refills | Status: DC | PRN

## 2016-09-27 NOTE — Patient Instructions (Addendum)
Please stay well hydrated and get plenty of rest. Place a humidifier in the bedroom. Use saline nasal spray to flush out nasal passages. Start plain Mucinex-DM to thin out mucous. No Mucinex-D or Fast Max. Use the cough medication as directed for evening cough as it may make you drowsy.  Follow-up if symptoms are not improving over the next 3-4 days. Viral infections typically last 7-10 days.

## 2016-09-27 NOTE — Progress Notes (Signed)
Patient presents to clinic today c/o 3 days of cough with nasal congestion, PND and some mild chest congestion. Denies SOB or chest pain. Endorses low-grade fever last night with Tmax 99.4. No fever this morning. Endorses a recent flight from [place] and mother was sick with similar symptoms. Patient has taken Diabetic Tussin for symptom relief with only mild improvement.   Past Medical History:  Diagnosis Date  . Anemia   . Chicken pox   . Depression   . Diabetes mellitus without complication [facility])     Current Outpatient Prescriptions on File Prior to Visit  Medication Sig Dispense Refill  . Cholecalciferol (D3-1000) 1000 UNITS tablet Take 2,000 Units by mouth daily.    . ferrous sulfate 325 (65 FE) MG tablet Take 1 tablet (325 mg total) by mouth daily with breakfast. 30 tablet 5  . LOW-OGESTREL 0.3-30 MG-MCG tablet     . metFORMIN (GLUCOPHAGE) 1000 MG tablet TAKE 1 TABLET(1000 MG) BY MOUTH TWICE DAILY WITH A MEAL 180 tablet 1  . valACYclovir (VALTREX) 500 MG tablet TAKE 1 TABLET BY MOUTH EVERY DAY (Patient taking differently: TAKE 1 TABLET BY MOUTH EVERY DAY as needed for flare ups) 30 tablet 1  . Lorcaserin HCl (BELVIQ) 10 MG TABS Take 1 tablet by mouth 2 (two) times daily. (Patient not taking: Reported on 09/27/2016) 60 tablet 3   No current facility-administered medications on file prior to visit.     No Known Allergies  Family History  Problem Relation Age of Onset  . Cancer Mother     breast  . Heart disease Maternal Grandmother   . Stroke Maternal Grandmother   . Hypertension Maternal Grandmother     Social History   Social History  . Marital status: Single    Spouse name: N/A  . Number of children: N/A  . Years of education: N/A   Social History Main Topics  . Smoking status: Never Smoker  . Smokeless tobacco: Never Used  . Alcohol use No  . Drug use: No  . Sexual activity: No   Other Topics Concern  . None   Social History Narrative  . None    Review of  Systems - See HPI.  All other ROS are negative.  BP 130/88   Pulse 71   Temp 98.4 F (36.9 C) (Oral)   Resp 16   Ht 5\' 4"  (1.626 m)   Wt (!) 370 lb (167.8 kg)   SpO2 98%   BMI 63.51 kg/m   Physical Exam  Constitutional: She is oriented to person, place, and time and well-developed, well-nourished, and in no distress.  HENT:  Head: Normocephalic and atraumatic.  Right Ear: Tympanic membrane normal.  Left Ear: Tympanic membrane normal.  Nose: Rhinorrhea present. No mucosal edema. Right sinus exhibits no maxillary sinus tenderness and no frontal sinus tenderness. Left sinus exhibits no maxillary sinus tenderness and no frontal sinus tenderness.  Mouth/Throat: Uvula is midline, oropharynx is clear and moist and mucous membranes are normal.  Eyes: Conjunctivae are normal.  Neck: Neck supple.  Cardiovascular: Normal rate, regular rhythm, normal heart sounds and intact distal pulses.   Pulmonary/Chest: Effort normal and breath sounds normal. No respiratory distress. She has no wheezes. She has no rales. She exhibits no tenderness.  Lymphadenopathy:    She has no cervical adenopathy.  Neurological: She is alert and oriented to person, place, and time.  Skin: Skin is warm and dry. No rash noted.  Psychiatric: Affect normal.  Vitals reviewed.  Recent Results (from the past 2160 hour(s))  Hemoglobin A1c     Status: Abnormal   Collection Time: 09/03/16 11:13 AM  Result Value Ref Range   Hgb A1c MFr Bld 7.9 (H) 4.6 - 6.5 %    Comment: Glycemic Control Guidelines for People with Diabetes:Non Diabetic:  <6%Goal of Therapy: <7%Additional Action Suggested:  123456   Basic metabolic panel     Status: Abnormal   Collection Time: 09/03/16 11:13 AM  Result Value Ref Range   Sodium 136 135 - 145 mEq/L   Potassium 4.4 3.5 - 5.1 mEq/L   Chloride 100 96 - 112 mEq/L   CO2 28 19 - 32 mEq/L   Glucose, Bld 128 (H) 70 - 99 mg/dL   BUN 7 6 - 23 mg/dL   Creatinine, Ser 0.74 0.40 - 1.20 mg/dL   Calcium  9.2 8.4 - 10.5 mg/dL   GFR 114.66 >60.00 mL/min  TSH     Status: None   Collection Time: 09/03/16 11:13 AM  Result Value Ref Range   TSH 2.17 0.35 - 4.50 uIU/mL  CBC with Differential/Platelet     Status: Abnormal   Collection Time: 09/03/16 11:13 AM  Result Value Ref Range   WBC 8.9 4.0 - 10.5 K/uL   RBC 5.27 (H) 3.87 - 5.11 Mil/uL   Hemoglobin 10.1 (L) 12.0 - 15.0 g/dL   HCT 32.9 (L) 36.0 - 46.0 %   MCV 62.5 Repeated and verified X2. (L) 78.0 - 100.0 fl   MCHC 30.6 30.0 - 36.0 g/dL   RDW 19.4 (H) 11.5 - 15.5 %   Platelets 522.0 (H) 150.0 - 400.0 K/uL   Neutrophils Relative % 60.0 43.0 - 77.0 %   Lymphocytes Relative 31.4 12.0 - 46.0 %   Monocytes Relative 6.1 3.0 - 12.0 %   Eosinophils Relative 1.9 0.0 - 5.0 %   Basophils Relative 0.6 0.0 - 3.0 %   Neutro Abs 5.3 1.4 - 7.7 K/uL   Lymphs Abs 2.8 0.7 - 4.0 K/uL   Monocytes Absolute 0.5 0.1 - 1.0 K/uL   Eosinophils Absolute 0.2 0.0 - 0.7 K/uL   Basophils Absolute 0.1 0.0 - 0.1 K/uL  Hepatic function panel     Status: None   Collection Time: 09/03/16 11:13 AM  Result Value Ref Range   Total Bilirubin 0.3 0.2 - 1.2 mg/dL   Bilirubin, Direct 0.1 0.0 - 0.3 mg/dL   Alkaline Phosphatase 60 39 - 117 U/L   AST 21 0 - 37 U/L   ALT 23 0 - 35 U/L   Total Protein 7.8 6.0 - 8.3 g/dL   Albumin 3.8 3.5 - 5.2 g/dL  Lipid panel     Status: None   Collection Time: 09/03/16 11:13 AM  Result Value Ref Range   Cholesterol 88 0 - 200 mg/dL    Comment: ATP III Classification       Desirable:  < 200 mg/dL               Borderline High:  200 - 239 mg/dL          High:  > = 240 mg/dL   Triglycerides 44.0 0.0 - 149.0 mg/dL    Comment: Normal:  <150 mg/dLBorderline High:  150 - 199 mg/dL   HDL 42.00 >39.00 mg/dL   VLDL 8.8 0.0 - 40.0 mg/dL   LDL Cholesterol 37 0 - 99 mg/dL   Total CHOL/HDL Ratio 2     Comment:  Men          Women1/2 Average Risk     3.4          3.3Average Risk          5.0          4.42X Average Risk          9.6           7.13X Average Risk          15.0          11.0                       NonHDL 45.68     Comment: NOTE:  Non-HDL goal should be 30 mg/dL higher than patient's LDL goal (i.e. LDL goal of < 70 mg/dL, would have non-HDL goal of < 100 mg/dL)  Urine Microalbumin w/creat. ratio     Status: Abnormal   Collection Time: 09/03/16 11:13 AM  Result Value Ref Range   Microalb, Ur 12.2 (H) 0.0 - 1.9 mg/dL   Creatinine,U 293.8 mg/dL   Microalb Creat Ratio 4.2 0.0 - 30.0 mg/g    Assessment/Plan: 1. Viral URI with cough Supportive measures and OTC medications reviewed. Start Hycodan for evening cough. Return precautions discussed.    Leeanne Rio, PA-C

## 2016-09-27 NOTE — Progress Notes (Signed)
Pre visit review using our clinic review tool, if applicable. No additional management support is needed unless otherwise documented below in the visit note. 

## 2016-10-22 ENCOUNTER — Ambulatory Visit (INDEPENDENT_AMBULATORY_CARE_PROVIDER_SITE_OTHER): Payer: BLUE CROSS/BLUE SHIELD | Admitting: Family Medicine

## 2016-10-22 ENCOUNTER — Encounter: Payer: Self-pay | Admitting: Family Medicine

## 2016-10-22 NOTE — Progress Notes (Signed)
   Subjective:    Patient ID: Sarah Faulkner, female    DOB: [birth year], 36 y.o.   MRN: [id number]  HPI Obesity- chronic problem.  Pt started Belviq at last visit.  Pt is down 13 lbs!  It is not taking 'every single day but I am taking pretty often'.  Exercising regularly- going to gym, walking 35-40 minutes 'a couple times a week'.  Pt has eliminated a lot of carbs, has eliminated most drinks w/ calories- drinking mostly water and coffee.  Pt is having less SOB w/ exertion.  BP is well controlled.  No CP.  Denies HAs.   Review of Systems For ROS see HPI     Objective:   Physical Exam  Constitutional: She is oriented to person, place, and time. She appears well-developed and well-nourished. No distress.  obese  HENT:  Head: Normocephalic and atraumatic.  Eyes: Conjunctivae and EOM are normal. Pupils are equal, round, and reactive to light.  Neck: Normal range of motion. Neck supple. No thyromegaly present.  Cardiovascular: Normal rate, regular rhythm, normal heart sounds and intact distal pulses.   No murmur heard. Pulmonary/Chest: Effort normal and breath sounds normal. No respiratory distress.  Abdominal: Soft. She exhibits no distension. There is no tenderness.  Musculoskeletal: She exhibits no edema.  Lymphadenopathy:    She has no cervical adenopathy.  Neurological: She is alert and oriented to person, place, and time.  Skin: Skin is warm and dry.  Psychiatric: She has a normal mood and affect. Her behavior is normal.  Vitals reviewed.         Assessment & Plan:

## 2016-10-22 NOTE — Progress Notes (Signed)
Pre visit review using our clinic review tool, if applicable. No additional management support is needed unless otherwise documented below in the visit note. 

## 2016-10-22 NOTE — Assessment & Plan Note (Signed)
Ongoing issue for pt.  She has lost 13 lbs since starting Belviq.  She is tolerating this w/o difficulty.  Applauded her efforts.  Will continue to follow.

## 2016-10-22 NOTE — Patient Instructions (Signed)
Follow up in late March/early April to recheck diabetes Continue the Belviq- it's working!!! Keep up the good work on healthy diet and regular exercise- I'm SO proud of you!!! Call with any questions or concerns Happy New Year!!!

## 2016-10-24 ENCOUNTER — Other Ambulatory Visit: Payer: Self-pay | Admitting: Family Medicine

## 2016-12-24 ENCOUNTER — Ambulatory Visit: Payer: BLUE CROSS/BLUE SHIELD | Admitting: Family Medicine

## 2017-01-02 ENCOUNTER — Ambulatory Visit: Payer: BLUE CROSS/BLUE SHIELD | Admitting: Family Medicine

## 2017-01-16 ENCOUNTER — Other Ambulatory Visit: Payer: Self-pay | Admitting: Family Medicine

## 2017-01-22 ENCOUNTER — Encounter: Payer: Self-pay | Admitting: Family Medicine

## 2017-01-22 ENCOUNTER — Ambulatory Visit (INDEPENDENT_AMBULATORY_CARE_PROVIDER_SITE_OTHER): Payer: BLUE CROSS/BLUE SHIELD | Admitting: Family Medicine

## 2017-01-22 VITALS — BP 126/83 | HR 78 | Temp 98.3°F | Resp 16 | Ht 64.0 in | Wt 337.1 lb

## 2017-01-22 DIAGNOSIS — E119 Type 2 diabetes mellitus without complications: Secondary | ICD-10-CM

## 2017-01-22 LAB — BASIC METABOLIC PANEL
BUN: 6 mg/dL (ref 6–23)
CO2: 28 mEq/L (ref 19–32)
Calcium: 9.2 mg/dL (ref 8.4–10.5)
Chloride: 102 mEq/L (ref 96–112)
Creatinine, Ser: 0.74 mg/dL (ref 0.40–1.20)
GFR: 114.41 mL/min (ref >60.00)
GLUCOSE: 90 mg/dL (ref 70–99)
POTASSIUM: 4.5 meq/L (ref 3.5–5.1)
SODIUM: 136 meq/L (ref 135–145)

## 2017-01-22 LAB — HEMOGLOBIN A1C: HEMOGLOBIN A1C: 6.7 % — AB (ref 4.6–6.5)

## 2017-01-22 NOTE — Patient Instructions (Signed)
Schedule your complete physical in 3-4 months We'll notify you of your lab results and make any changes if needed Keep up the good work on healthy diet and regular exercise- you're killing it!!! Call and schedule your eye exam!!! Call with any questions or concerns Happy Spring!!!!

## 2017-01-22 NOTE — Progress Notes (Signed)
   Subjective:    Patient ID: Sarah Faulkner, female    DOB: 06-14-1981, 36 y.o.   MRN: [id number]  HPI DM- chronic problem, on Metformin 1000mg  BID.  UTD on foot exam, microalbumin.  Due for eye exam- pt plans to schedule.  Pt has lost 21 lbs since 1/29!  Exercising regularly- strength training and cardio.  Has made big diet changes.  No CP, SOB, HAs, visual changes, abd pain, N/V.  Denies symptomatic lows.  No numbness/tingling of hands/feet.   Review of Systems For ROS see HPI     Objective:   Physical Exam  Constitutional: She is oriented to person, place, and time. She appears well-developed and well-nourished. No distress.  obese  HENT:  Head: Normocephalic and atraumatic.  Eyes: Conjunctivae and EOM are normal. Pupils are equal, round, and reactive to light.  Neck: Normal range of motion. Neck supple. No thyromegaly present.  Cardiovascular: Normal rate, regular rhythm, normal heart sounds and intact distal pulses.   No murmur heard. Pulmonary/Chest: Effort normal and breath sounds normal. No respiratory distress.  Abdominal: Soft. She exhibits no distension. There is no tenderness.  Musculoskeletal: She exhibits no edema.  Lymphadenopathy:    She has no cervical adenopathy.  Neurological: She is alert and oriented to person, place, and time.  Skin: Skin is warm and dry.  Psychiatric: She has a normal mood and affect. Her behavior is normal.  Vitals reviewed.         Assessment & Plan:

## 2017-01-22 NOTE — Assessment & Plan Note (Signed)
Chronic problem.  Pt has lost 21 lbs since 1/29.  She is eating better and exercising regularly.  Applauded her efforts.  Will continue to follow.

## 2017-01-22 NOTE — Progress Notes (Signed)
Pre visit review using our clinic review tool, if applicable. No additional management support is needed unless otherwise documented below in the visit note. 

## 2017-01-22 NOTE — Assessment & Plan Note (Signed)
Chronic problem.  Tolerating Metformin w/o difficulty.  UTD on foot exam, microalbumin.  Due for eye exam- pt plans to schedule.  Applauded her efforts for healthy diet and regular exercise.  Will follow.

## 2017-02-06 ENCOUNTER — Encounter: Payer: Self-pay | Admitting: Gynecology

## 2017-04-21 ENCOUNTER — Other Ambulatory Visit: Payer: Self-pay | Admitting: Family Medicine

## 2017-05-20 DIAGNOSIS — E119 Type 2 diabetes mellitus without complications: Secondary | ICD-10-CM | POA: Diagnosis not present

## 2017-05-20 LAB — HM DIABETES EYE EXAM

## 2017-05-23 ENCOUNTER — Other Ambulatory Visit: Payer: Self-pay | Admitting: General Practice

## 2017-05-23 ENCOUNTER — Ambulatory Visit (INDEPENDENT_AMBULATORY_CARE_PROVIDER_SITE_OTHER): Payer: BLUE CROSS/BLUE SHIELD | Admitting: Family Medicine

## 2017-05-23 ENCOUNTER — Encounter: Payer: Self-pay | Admitting: Family Medicine

## 2017-05-23 VITALS — BP 124/86 | HR 74 | Temp 98.1°F | Resp 16 | Ht 64.0 in | Wt 316.0 lb

## 2017-05-23 DIAGNOSIS — Z0000 Encounter for general adult medical examination without abnormal findings: Secondary | ICD-10-CM | POA: Diagnosis not present

## 2017-05-23 DIAGNOSIS — E119 Type 2 diabetes mellitus without complications: Secondary | ICD-10-CM

## 2017-05-23 DIAGNOSIS — Z803 Family history of malignant neoplasm of breast: Secondary | ICD-10-CM | POA: Diagnosis not present

## 2017-05-23 DIAGNOSIS — E559 Vitamin D deficiency, unspecified: Secondary | ICD-10-CM | POA: Diagnosis not present

## 2017-05-23 DIAGNOSIS — Z23 Encounter for immunization: Secondary | ICD-10-CM | POA: Diagnosis not present

## 2017-05-23 LAB — CBC WITH DIFFERENTIAL/PLATELET
BASOS PCT: 0.5 % (ref 0.0–3.0)
Basophils Absolute: 0 10*3/uL (ref 0.0–0.1)
EOS PCT: 1.9 % (ref 0.0–5.0)
Eosinophils Absolute: 0.1 10*3/uL (ref 0.0–0.7)
HCT: 28.9 % — ABNORMAL LOW (ref 36.0–46.0)
Hemoglobin: 8.8 g/dL — ABNORMAL LOW (ref 12.0–15.0)
LYMPHS ABS: 1.7 10*3/uL (ref 0.7–4.0)
Lymphocytes Relative: 29.9 % (ref 12.0–46.0)
MCHC: 30.3 g/dL (ref 30.0–36.0)
MONO ABS: 0.4 10*3/uL (ref 0.1–1.0)
MONOS PCT: 7.9 % (ref 3.0–12.0)
NEUTROS PCT: 59.8 % (ref 43.0–77.0)
Neutro Abs: 3.3 10*3/uL (ref 1.4–7.7)
Platelets: 552 10*3/uL — ABNORMAL HIGH (ref 150.0–400.0)
RBC: 4.56 Mil/uL (ref 3.87–5.11)
RDW: 20.2 % — AB (ref 11.5–15.5)
WBC: 5.6 10*3/uL (ref 4.0–10.5)

## 2017-05-23 LAB — HEPATIC FUNCTION PANEL
ALBUMIN: 3.7 g/dL (ref 3.5–5.2)
ALK PHOS: 46 U/L (ref 39–117)
ALT: 11 U/L (ref 0–35)
AST: 11 U/L (ref 0–37)
BILIRUBIN DIRECT: 0.1 mg/dL (ref 0.0–0.3)
BILIRUBIN TOTAL: 0.4 mg/dL (ref 0.2–1.2)
Total Protein: 7 g/dL (ref 6.0–8.3)

## 2017-05-23 LAB — BASIC METABOLIC PANEL
BUN: 7 mg/dL (ref 6–23)
CHLORIDE: 102 meq/L (ref 96–112)
CO2: 27 mEq/L (ref 19–32)
Calcium: 9.4 mg/dL (ref 8.4–10.5)
Creatinine, Ser: 0.74 mg/dL (ref 0.40–1.20)
GFR: 114.2 mL/min (ref >60.00)
Glucose, Bld: 96 mg/dL (ref 70–99)
POTASSIUM: 4.4 meq/L (ref 3.5–5.1)
SODIUM: 135 meq/L (ref 135–145)

## 2017-05-23 LAB — HEMOGLOBIN A1C: Hgb A1c MFr Bld: 5.9 % (ref 4.6–6.5)

## 2017-05-23 LAB — VITAMIN D 25 HYDROXY (VIT D DEFICIENCY, FRACTURES): VITD: 15.53 ng/mL — AB (ref 30.00–100.00)

## 2017-05-23 LAB — LIPID PANEL
CHOL/HDL RATIO: 2
Cholesterol: 104 mg/dL (ref 0–200)
HDL: 48.1 mg/dL (ref >39.00)
LDL Cholesterol: 49 mg/dL (ref 0–99)
NonHDL: 56.25
TRIGLYCERIDES: 36 mg/dL (ref 0.0–149.0)
VLDL: 7.2 mg/dL (ref 0.0–40.0)

## 2017-05-23 LAB — TSH: TSH: 1.4 u[IU]/mL (ref 0.35–4.50)

## 2017-05-23 MED ORDER — VITAMIN D (ERGOCALCIFEROL) 1.25 MG (50000 UNIT) PO CAPS: 50000.0000 [IU] | ORAL_CAPSULE | ORAL | 0 refills | Status: DC

## 2017-05-23 MED ORDER — FERROUS SULFATE 325 (65 FE) MG PO TABS: 325.0000 mg | ORAL_TABLET | Freq: Every day | ORAL | 5 refills | Status: DC

## 2017-05-23 NOTE — Progress Notes (Signed)
   Subjective:    Patient ID: Sarah Faulkner, female    DOB: 1981-06-29, 36 y.o.   MRN: [id number]  HPI CPE- Pt has lost 21 lbs since last visit.  UTD on pap.  UTD on eye exam, foot exam, microalbumin.   Review of Systems Patient reports no vision/ hearing changes, adenopathy,fever, weight change,  persistant/recurrent hoarseness , swallowing issues, chest pain, palpitations, edema, persistant/recurrent cough, hemoptysis, dyspnea (rest/exertional/paroxysmal nocturnal), gastrointestinal bleeding (melena, rectal bleeding), abdominal pain, significant heartburn, bowel changes, GU symptoms (dysuria, hematuria, incontinence), Gyn symptoms (abnormal  bleeding, pain),  syncope, focal weakness, memory loss, numbness & tingling, skin/hair/nail changes, abnormal bruising or bleeding, anxiety, or depression.     Objective:   Physical Exam General Appearance:    Alert, cooperative, no distress, appears stated age, obese  Head:    Normocephalic, without obvious abnormality, atraumatic  Eyes:    PERRL, conjunctiva/corneas clear, EOM's intact, fundi    benign, both eyes  Ears:    Normal TM's and external ear canals, both ears  Nose:   Nares normal, septum midline, mucosa normal, no drainage    or sinus tenderness  Throat:   Lips, mucosa, and tongue normal; teeth and gums normal  Neck:   Supple, symmetrical, trachea midline, no adenopathy;    Thyroid: no enlargement/tenderness/nodules  Back:     Symmetric, no curvature, ROM normal, no CVA tenderness  Lungs:     Clear to auscultation bilaterally, respirations unlabored  Chest Wall:    No tenderness or deformity   Heart:    Regular rate and rhythm, S1 and S2 normal, no murmur, rub   or gallop  Breast Exam:    Deferred to mammo  Abdomen:     Soft, non-tender, bowel sounds active all four quadrants,    no masses, no organomegaly  Genitalia:    Deferred  Rectal:    Extremities:   Extremities normal, atraumatic, no cyanosis or edema  Pulses:   2+ and  symmetric all extremities  Skin:   Skin color, texture, turgor normal, no rashes or lesions  Lymph nodes:   Cervical, supraclavicular, and axillary nodes normal  Neurologic:   CNII-XII intact, normal strength, sensation and reflexes    throughout          Assessment & Plan:

## 2017-05-23 NOTE — Assessment & Plan Note (Signed)
Pt has hx of this.  Check labs.  Replete prn. 

## 2017-05-23 NOTE — Assessment & Plan Note (Signed)
New.  Get mammogram

## 2017-05-23 NOTE — Patient Instructions (Signed)
Follow up in 3-4 months to recheck sugar and weight loss progress We'll notify you of your lab results and make any changes if needed Continue to work on healthy diet and regular exercise- you're doing it!!! We'll call you with your mammogram appt Call with any questions or concerns I'M SO PROUD OF YOU!!!

## 2017-05-23 NOTE — Progress Notes (Signed)
Pre visit review using our clinic review tool, if applicable. No additional management support is needed unless otherwise documented below in the visit note. 

## 2017-05-23 NOTE — Assessment & Plan Note (Signed)
Ongoing issue.  Pt has lost 21 lbs since last visit.  Applauded her efforts.  Check labs to risk stratify.  Will continue to follow.

## 2017-05-23 NOTE — Assessment & Plan Note (Signed)
Pt's PE WNL w/ exception of obesity.  UTD on pap.  Due to mom's hx of breast cancer, will get mammo.  Check labs.  Anticipatory guidance provided.

## 2017-05-23 NOTE — Assessment & Plan Note (Signed)
Ongoing issue.  Doing well on Metformin.  UTD on eye exam, foot exam, microalbumin.  Pt has lost another 21 lbs!  Applauded her efforts!  Check labs.  Adjust meds prn

## 2017-05-30 ENCOUNTER — Other Ambulatory Visit: Payer: Self-pay | Admitting: Family Medicine

## 2017-05-30 DIAGNOSIS — Z803 Family history of malignant neoplasm of breast: Secondary | ICD-10-CM

## 2017-06-03 ENCOUNTER — Encounter: Payer: Self-pay | Admitting: General Practice

## 2017-06-06 ENCOUNTER — Ambulatory Visit
Admission: RE | Admit: 2017-06-06 | Discharge: 2017-06-06 | Disposition: A | Discharge status: Home / Self Care | Payer: BLUE CROSS/BLUE SHIELD | Source: Ambulatory Visit | Attending: Family Medicine | Admitting: Family Medicine

## 2017-06-06 DIAGNOSIS — Z1231 Encounter for screening mammogram for malignant neoplasm of breast: Secondary | ICD-10-CM | POA: Diagnosis not present

## 2017-06-06 DIAGNOSIS — Z803 Family history of malignant neoplasm of breast: Secondary | ICD-10-CM

## 2017-06-07 ENCOUNTER — Encounter: Payer: Self-pay | Admitting: Family Medicine

## 2017-06-10 MED ORDER — LORCASERIN HCL 10 MG PO TABS: 1.0000 | ORAL_TABLET | Freq: Two times a day (BID) | ORAL | 3 refills | Status: DC

## 2017-06-11 ENCOUNTER — Telehealth: Payer: Self-pay | Admitting: *Deleted

## 2017-06-11 NOTE — Telephone Encounter (Signed)
PA started through cover my meds for Belviq.   Awaiting authorization  KEY: [facility]

## 2017-06-13 NOTE — Telephone Encounter (Signed)
Medications approved, fax sent to pharmacy.

## 2017-08-07 ENCOUNTER — Encounter: Payer: Self-pay | Admitting: Family Medicine

## 2017-08-19 ENCOUNTER — Other Ambulatory Visit: Payer: Self-pay | Admitting: Family Medicine

## 2017-08-19 MED ORDER — METFORMIN HCL 1000 MG PO TABS: 1000.0000 mg | ORAL_TABLET | Freq: Two times a day (BID) | ORAL | 0 refills | Status: DC

## 2017-09-02 ENCOUNTER — Ambulatory Visit: Payer: BLUE CROSS/BLUE SHIELD | Admitting: Family Medicine

## 2017-09-30 ENCOUNTER — Other Ambulatory Visit: Payer: Self-pay | Admitting: Family Medicine

## 2017-10-21 ENCOUNTER — Encounter: Payer: Self-pay | Admitting: General Practice

## 2017-10-21 ENCOUNTER — Other Ambulatory Visit: Payer: Self-pay

## 2017-10-21 ENCOUNTER — Ambulatory Visit: Payer: BLUE CROSS/BLUE SHIELD | Admitting: Family Medicine

## 2017-10-21 ENCOUNTER — Encounter: Payer: Self-pay | Admitting: Family Medicine

## 2017-10-21 VITALS — BP 122/80 | HR 74 | Temp 98.0°F | Resp 16 | Ht 64.0 in | Wt 312.4 lb

## 2017-10-21 DIAGNOSIS — E119 Type 2 diabetes mellitus without complications: Secondary | ICD-10-CM | POA: Diagnosis not present

## 2017-10-21 LAB — BASIC METABOLIC PANEL
BUN: 9 mg/dL (ref 6–23)
CO2: 27 meq/L (ref 19–32)
Calcium: 8.9 mg/dL (ref 8.4–10.5)
Chloride: 103 mEq/L (ref 96–112)
Creatinine, Ser: 0.7 mg/dL (ref 0.40–1.20)
GFR: 121.48 mL/min (ref >60.00)
GLUCOSE: 104 mg/dL — AB (ref 70–99)
POTASSIUM: 3.8 meq/L (ref 3.5–5.1)
Sodium: 138 mEq/L (ref 135–145)

## 2017-10-21 LAB — MICROALBUMIN / CREATININE URINE RATIO
Creatinine,U: 208.9 mg/dL
Microalb Creat Ratio: 0.4 mg/g (ref 0.0–30.0)
Microalb, Ur: 0.8 mg/dL (ref 0.0–1.9)

## 2017-10-21 LAB — HEMOGLOBIN A1C: Hgb A1c MFr Bld: 6.4 % (ref 4.6–6.5)

## 2017-10-21 NOTE — Patient Instructions (Signed)
Follow up in 3-4 months to recheck sugars We'll notify you of your lab results and make any changes if needed Keep up the good work on healthy diet and regular exercise- you look great!!! Call with any questions or concerns Happy New Year!!!

## 2017-10-21 NOTE — Progress Notes (Signed)
   Subjective:    Patient ID: Sarah Faulkner, female    DOB: Jun 30, 1981, 37 y.o.   MRN: [id number]  HPI Obesity- ongoing issue for pt.  She has lost another 4 lbs since August.  DM- chronic problem.  Pt has excellent control on Metformin 1000mg  BID.  UTD on eye exam.  Due for foot exam and microalbumin.  She has lost another 4 lbs!  She is exercising and focusing on a low carb diet.  Denies symptomatic lows.  No CP, SOB, HAs, visual changes, abd pain, N/V, numbness/tingling of hands/feet.  Pt is exercising twice weekly.   Review of Systems For ROS see HPI     Objective:   Physical Exam  Constitutional: She is oriented to person, place, and time. She appears well-developed and well-nourished. No distress.  HENT:  Head: Normocephalic and atraumatic.  Eyes: Conjunctivae and EOM are normal. Pupils are equal, round, and reactive to light.  Neck: Normal range of motion. Neck supple. No thyromegaly present.  Cardiovascular: Normal rate, regular rhythm, normal heart sounds and intact distal pulses.  No murmur heard. Pulmonary/Chest: Effort normal and breath sounds normal. No respiratory distress.  Abdominal: Soft. She exhibits no distension. There is no tenderness.  Musculoskeletal: She exhibits no edema.  Lymphadenopathy:    She has no cervical adenopathy.  Neurological: She is alert and oriented to person, place, and time.  Skin: Skin is warm and dry.  Psychiatric: She has a normal mood and affect. Her behavior is normal.  Vitals reviewed.         Assessment & Plan:

## 2017-10-21 NOTE — Assessment & Plan Note (Signed)
Ongoing issue.  She continues to lose weight w/ diet and exercise.  Applauded her efforts.  Will continue to follow.

## 2017-10-21 NOTE — Assessment & Plan Note (Signed)
Chronic problem.  Pt has had excellent control since she committed to weight loss.  UTD on eye exam, foot exam done today.  Microalbumin ordered.  Applauded efforts at healthy diet and regular exercise.  Check labs.  Adjust meds prn

## 2017-11-28 ENCOUNTER — Other Ambulatory Visit: Payer: Self-pay | Admitting: Family Medicine

## 2017-11-28 MED ORDER — METFORMIN HCL 1000 MG PO TABS: 1000.0000 mg | ORAL_TABLET | Freq: Two times a day (BID) | ORAL | 0 refills | Status: DC

## 2018-02-18 ENCOUNTER — Ambulatory Visit: Payer: BLUE CROSS/BLUE SHIELD | Admitting: Family Medicine

## 2018-03-04 ENCOUNTER — Other Ambulatory Visit: Payer: Self-pay

## 2018-03-04 ENCOUNTER — Encounter: Payer: Self-pay | Admitting: Family Medicine

## 2018-03-04 ENCOUNTER — Telehealth: Payer: Self-pay | Admitting: Emergency Medicine

## 2018-03-04 ENCOUNTER — Ambulatory Visit: Payer: BLUE CROSS/BLUE SHIELD | Admitting: Family Medicine

## 2018-03-04 VITALS — BP 138/78 | HR 72 | Temp 98.7°F | Resp 17 | Ht 64.0 in | Wt 325.6 lb

## 2018-03-04 DIAGNOSIS — E119 Type 2 diabetes mellitus without complications: Secondary | ICD-10-CM

## 2018-03-04 DIAGNOSIS — D509 Iron deficiency anemia, unspecified: Secondary | ICD-10-CM

## 2018-03-04 LAB — CBC WITH DIFFERENTIAL/PLATELET
BASOS PCT: 0.5 % (ref 0.0–3.0)
Basophils Absolute: 0 10*3/uL (ref 0.0–0.1)
EOS PCT: 1.9 % (ref 0.0–5.0)
Eosinophils Absolute: 0.1 10*3/uL (ref 0.0–0.7)
HCT: 25.8 % — ABNORMAL LOW (ref 36.0–46.0)
Lymphocytes Relative: 32.4 % (ref 12.0–46.0)
Lymphs Abs: 2.3 10*3/uL (ref 0.7–4.0)
MCHC: 30 g/dL (ref 30.0–36.0)
MONOS PCT: 7.3 % (ref 3.0–12.0)
Monocytes Absolute: 0.5 10*3/uL (ref 0.1–1.0)
Neutro Abs: 4 10*3/uL (ref 1.4–7.7)
Neutrophils Relative %: 57.9 % (ref 43.0–77.0)
Platelets: 643 10*3/uL — ABNORMAL HIGH (ref 150.0–400.0)
RBC: 4.36 Mil/uL (ref 3.87–5.11)
RDW: 19.8 % — AB (ref 11.5–15.5)
WBC: 7 10*3/uL (ref 4.0–10.5)

## 2018-03-04 LAB — BASIC METABOLIC PANEL
BUN: 8 mg/dL (ref 6–23)
CHLORIDE: 105 meq/L (ref 96–112)
CO2: 26 mEq/L (ref 19–32)
Calcium: 9 mg/dL (ref 8.4–10.5)
Creatinine, Ser: 0.69 mg/dL (ref 0.40–1.20)
GFR: 123.26 mL/min (ref >60.00)
Glucose, Bld: 102 mg/dL — ABNORMAL HIGH (ref 70–99)
POTASSIUM: 4.1 meq/L (ref 3.5–5.1)
Sodium: 138 mEq/L (ref 135–145)

## 2018-03-04 LAB — LIPID PANEL
Cholesterol: 100 mg/dL (ref 0–200)
HDL: 48 mg/dL (ref >39.00)
LDL Cholesterol: 43 mg/dL (ref 0–99)
NonHDL: 51.5
Total CHOL/HDL Ratio: 2
Triglycerides: 43 mg/dL (ref 0.0–149.0)
VLDL: 8.6 mg/dL (ref 0.0–40.0)

## 2018-03-04 LAB — HEPATIC FUNCTION PANEL
ALK PHOS: 44 U/L (ref 39–117)
ALT: 8 U/L (ref 0–35)
AST: 10 U/L (ref 0–37)
Albumin: 3.8 g/dL (ref 3.5–5.2)
BILIRUBIN TOTAL: 0.3 mg/dL (ref 0.2–1.2)
Bilirubin, Direct: 0.1 mg/dL (ref 0.0–0.3)
Total Protein: 7.1 g/dL (ref 6.0–8.3)

## 2018-03-04 LAB — TSH: TSH: 1.19 u[IU]/mL (ref 0.35–4.50)

## 2018-03-04 LAB — HEMOGLOBIN A1C: HEMOGLOBIN A1C: 5.9 % (ref 4.6–6.5)

## 2018-03-04 NOTE — Telephone Encounter (Signed)
Sarah Faulkner called with a critical Faulkner Hemoglobin 7.7.   Sarah Faulkner Patient, please review in her absence.   Doloris Hall,  LPN

## 2018-03-04 NOTE — Progress Notes (Signed)
   Subjective:    Patient ID: Sarah Faulkner, female    DOB: Jan 22, 1981, 37 y.o.   MRN: [id number]  HPI DM- chronic problem, on Metformin 1000mg  BID.  Pt has had weight fluctuations over the past 6 months- she is up 13 lbs from last visit.  This is upsetting to her.  UTD on eye exam, foot exam, microalbumin.  No CP, SOB, HAs, visual changes, edema, numbness/tingling of hands/feet.  No symptomatic lows.  No abd pain, N/V.   Review of Systems For ROS see HPI     Objective:   Physical Exam  Constitutional: She is oriented to person, place, and time. She appears well-developed and well-nourished. No distress.  obese  HENT:  Head: Normocephalic and atraumatic.  Eyes: Pupils are equal, round, and reactive to light. Conjunctivae and EOM are normal.  Neck: Normal range of motion. Neck supple. No thyromegaly present.  Cardiovascular: Normal rate, regular rhythm, normal heart sounds and intact distal pulses.  No murmur heard. Pulmonary/Chest: Effort normal and breath sounds normal. No respiratory distress.  Abdominal: Soft. She exhibits no distension. There is no tenderness.  Musculoskeletal: She exhibits no edema.  Lymphadenopathy:    She has no cervical adenopathy.  Neurological: She is alert and oriented to person, place, and time.  Skin: Skin is warm and dry.  Psychiatric: She has a normal mood and affect. Her behavior is normal.  Vitals reviewed.         Assessment & Plan:

## 2018-03-04 NOTE — Assessment & Plan Note (Signed)
Chronic problem.  Tolerating Metformin w/o difficulty.  UTD on foot exam, eye exam, and microalbumin.  Stressed need for healthy diet and regular exercise- pt is up 13 lbs after losing quite a bit of weight.  Check labs.  Adjust meds prn

## 2018-03-04 NOTE — Patient Instructions (Signed)
Schedule your complete physical in 3-4 months We'll notify you of your lab results and make any changes if needed Keep up the good work on healthy diet and regular exercise- you can do it! Schedule your eye exam for August Call with any questions or concerns Have a great summer!!

## 2018-03-04 NOTE — Telephone Encounter (Signed)
Reviewed chart; pt with history of iron deficiency anemia in this range.  Will defer mgt to PCP.  Pt is stable.

## 2018-03-05 ENCOUNTER — Other Ambulatory Visit: Payer: Self-pay | Admitting: General Practice

## 2018-03-05 MED ORDER — METFORMIN HCL 1000 MG PO TABS: 1000.0000 mg | ORAL_TABLET | Freq: Two times a day (BID) | ORAL | 1 refills | Status: DC

## 2018-03-10 ENCOUNTER — Ambulatory Visit: Payer: BLUE CROSS/BLUE SHIELD | Admitting: Family Medicine

## 2018-03-10 NOTE — Addendum Note (Signed)
Addended by: Desmond Dike L on: 03/10/2018 09:30 AM   Modules accepted: Orders

## 2018-03-10 NOTE — Telephone Encounter (Signed)
Called and spoke with pt. She does not always take the iron supplement daily. The GYN is treating her heavy menses with Birth Control they are hoping to keep her at 4 cycles a year due to heavy menses. Pt had just finished her cycle when she had labs drawn.   Pt was agreeable to referral orders were placed today.

## 2018-03-10 NOTE — Telephone Encounter (Signed)
Please verify if she is taking her iron supplement daily (based on medication hx, my guess is no).  And please ask if GYN is still managing this b/c if not, I would like to refer her to Hematology

## 2018-03-21 ENCOUNTER — Encounter: Payer: Self-pay | Admitting: Hematology

## 2018-03-21 ENCOUNTER — Telehealth: Payer: Self-pay | Admitting: Hematology

## 2018-03-21 NOTE — Telephone Encounter (Signed)
New referral received from Dr.Tabori for the pt to see a hematologist. Pt has been scheduled to see Dr. Irene Limbo on 7/18 at 10am. Pt aware to arrive 30 minutes early. Letter mailed.

## 2018-04-09 ENCOUNTER — Telehealth: Payer: Self-pay | Admitting: Hematology

## 2018-04-09 NOTE — Telephone Encounter (Signed)
Patient called and rescheduled 7/18 new patient appointment to 7/30. Patient has new date/time.

## 2018-04-10 ENCOUNTER — Inpatient Hospital Stay: Payer: BLUE CROSS/BLUE SHIELD | Admitting: Hematology

## 2018-04-22 ENCOUNTER — Inpatient Hospital Stay: Payer: BLUE CROSS/BLUE SHIELD | Attending: Hematology | Admitting: Hematology

## 2018-06-12 ENCOUNTER — Encounter: Payer: Self-pay | Admitting: Family Medicine

## 2018-06-12 MED ORDER — LORCASERIN HCL 10 MG PO TABS: 1.0000 | ORAL_TABLET | Freq: Two times a day (BID) | ORAL | 3 refills | Status: DC

## 2018-06-16 DIAGNOSIS — E119 Type 2 diabetes mellitus without complications: Secondary | ICD-10-CM | POA: Diagnosis not present

## 2018-06-16 LAB — HM DIABETES EYE EXAM

## 2018-06-25 ENCOUNTER — Encounter: Payer: Self-pay | Admitting: General Practice

## 2018-07-25 ENCOUNTER — Ambulatory Visit: Payer: BLUE CROSS/BLUE SHIELD | Admitting: Family Medicine

## 2018-08-24 ENCOUNTER — Other Ambulatory Visit: Payer: Self-pay | Admitting: Family Medicine

## 2018-11-17 ENCOUNTER — Other Ambulatory Visit: Payer: Self-pay | Admitting: Family Medicine

## 2018-12-08 ENCOUNTER — Ambulatory Visit (INDEPENDENT_AMBULATORY_CARE_PROVIDER_SITE_OTHER): Payer: BLUE CROSS/BLUE SHIELD | Admitting: Family Medicine

## 2018-12-08 ENCOUNTER — Encounter: Payer: Self-pay | Admitting: Family Medicine

## 2018-12-08 ENCOUNTER — Other Ambulatory Visit: Payer: Self-pay

## 2018-12-08 ENCOUNTER — Encounter

## 2018-12-08 VITALS — BP 128/84 | HR 86 | Temp 98.7°F | Resp 16 | Ht 64.0 in | Wt 343.0 lb

## 2018-12-08 DIAGNOSIS — E119 Type 2 diabetes mellitus without complications: Secondary | ICD-10-CM

## 2018-12-08 DIAGNOSIS — Z0000 Encounter for general adult medical examination without abnormal findings: Secondary | ICD-10-CM

## 2018-12-08 DIAGNOSIS — E559 Vitamin D deficiency, unspecified: Secondary | ICD-10-CM

## 2018-12-08 NOTE — Patient Instructions (Signed)
Follow up in 3-4 months to recheck diabetes and weight loss progress We'll notify you of your lab results and make any changes if needed Continue to work on healthy diet and regular exercise- you can do it!!! If you are again feeling down or sad, let me know! Call with any questions or concerns Stay Safe!!!

## 2018-12-08 NOTE — Progress Notes (Signed)
   Subjective:    Patient ID: Sarah Faulkner, female    DOB: 05/18/1981, 38 y.o.   MRN: [id number]  HPI CPE- UTD on eye exam, due for foot exam.  Pt has regained 17 lbs- due to her recent depression.   Review of Systems Patient reports no vision/ hearing changes, adenopathy,fever,  persistant/recurrent hoarseness , swallowing issues, chest pain, palpitations, edema, persistant/recurrent cough, hemoptysis, dyspnea (rest/exertional/paroxysmal nocturnal), gastrointestinal bleeding (melena, rectal bleeding), abdominal pain, significant heartburn, bowel changes, GU symptoms (dysuria, hematuria, incontinence), Gyn symptoms (abnormal  bleeding, pain),  syncope, focal weakness, memory loss, numbness & tingling, skin/hair/nail changes, abnormal bruising or bleeding.   +depression- pt was feeling poorly from August til December.  Started counseling in November- this has helped.      Objective:   Physical Exam General Appearance:    Alert, cooperative, no distress, appears stated age, obese  Head:    Normocephalic, without obvious abnormality, atraumatic  Eyes:    PERRL, conjunctiva/corneas clear, EOM's intact, fundi    benign, both eyes  Ears:    Normal TM's and external ear canals, both ears  Nose:   Nares normal, septum midline, mucosa normal, no drainage    or sinus tenderness  Throat:   Lips, mucosa, and tongue normal; teeth and gums normal  Neck:   Supple, symmetrical, trachea midline, no adenopathy;    Thyroid: no enlargement/tenderness/nodules  Back:     Symmetric, no curvature, ROM normal, no CVA tenderness  Lungs:     Clear to auscultation bilaterally, respirations unlabored  Chest Wall:    No tenderness or deformity   Heart:    Regular rate and rhythm, S1 and S2 normal, no murmur, rub   or gallop  Breast Exam:    Deferred to GYN  Abdomen:     Soft, non-tender, bowel sounds active all four quadrants,    no masses, no organomegaly  Genitalia:    Deferred to GYN  Rectal:     Extremities:   Extremities normal, atraumatic, no cyanosis or edema  Pulses:   2+ and symmetric all extremities  Skin:   Skin color, texture, turgor normal, no rashes or lesions  Lymph nodes:   Cervical, supraclavicular, and axillary nodes normal  Neurologic:   CNII-XII intact, normal strength, sensation and reflexes    throughout          Assessment & Plan:

## 2018-12-08 NOTE — Assessment & Plan Note (Signed)
Deteriorated.  Pt has regained 17 lbs due to recent bout w/ depression.  Stressed need for healthy diet and regular exercise.  Check labs to risk stratify.  Will follow.

## 2018-12-08 NOTE — Assessment & Plan Note (Signed)
Pt's PE WNL w/ exception of obesity.  Stressed need for healthy diet and regular exercise.  Check labs.  Anticipatory guidance provided.

## 2018-12-08 NOTE — Assessment & Plan Note (Signed)
UTD on eye exam.  Foot exam done.  Tolerating Metformin w/o difficulty.  Stressed need for healthy diet and regular exercise.  Check labs.  Adjust meds prn

## 2018-12-08 NOTE — Assessment & Plan Note (Signed)
Pt has hx of this.  Check labs and replete prn. 

## 2018-12-09 ENCOUNTER — Other Ambulatory Visit: Payer: Self-pay | Admitting: General Practice

## 2018-12-09 ENCOUNTER — Other Ambulatory Visit: Payer: Self-pay | Admitting: Family Medicine

## 2018-12-09 LAB — HEPATIC FUNCTION PANEL
ALT: 8 U/L (ref 0–35)
AST: 10 U/L (ref 0–37)
Albumin: 3.9 g/dL (ref 3.5–5.2)
Alkaline Phosphatase: 49 U/L (ref 39–117)
Bilirubin, Direct: 0.1 mg/dL (ref 0.0–0.3)
Total Bilirubin: 0.3 mg/dL (ref 0.2–1.2)
Total Protein: 7.3 g/dL (ref 6.0–8.3)

## 2018-12-09 LAB — CBC WITH DIFFERENTIAL/PLATELET
Basophils Absolute: 0.1 10*3/uL (ref 0.0–0.1)
Basophils Relative: 0.7 % (ref 0.0–3.0)
Eosinophils Absolute: 0.2 10*3/uL (ref 0.0–0.7)
Eosinophils Relative: 2.7 % (ref 0.0–5.0)
HEMATOCRIT: 29 % — AB (ref 36.0–46.0)
Hemoglobin: 8.8 g/dL — ABNORMAL LOW (ref 12.0–15.0)
Lymphocytes Relative: 26.6 % (ref 12.0–46.0)
Lymphs Abs: 2.2 10*3/uL (ref 0.7–4.0)
MCHC: 30.3 g/dL (ref 30.0–36.0)
MCV: 63.1 fl — ABNORMAL LOW (ref 78.0–100.0)
Monocytes Absolute: 0.7 10*3/uL (ref 0.1–1.0)
Monocytes Relative: 8.2 % (ref 3.0–12.0)
Neutro Abs: 5.2 10*3/uL (ref 1.4–7.7)
Neutrophils Relative %: 61.8 % (ref 43.0–77.0)
Platelets: 520 10*3/uL — ABNORMAL HIGH (ref 150.0–400.0)
RBC: 4.59 Mil/uL (ref 3.87–5.11)
RDW: 19.2 % — ABNORMAL HIGH (ref 11.5–15.5)
WBC: 8.4 10*3/uL (ref 4.0–10.5)

## 2018-12-09 LAB — BASIC METABOLIC PANEL
BUN: 11 mg/dL (ref 6–23)
CO2: 25 mEq/L (ref 19–32)
Calcium: 9 mg/dL (ref 8.4–10.5)
Chloride: 102 mEq/L (ref 96–112)
Creatinine, Ser: 0.69 mg/dL (ref 0.40–1.20)
GFR: 115.49 mL/min (ref >60.00)
Glucose, Bld: 88 mg/dL (ref 70–99)
Potassium: 4.2 mEq/L (ref 3.5–5.1)
Sodium: 135 mEq/L (ref 135–145)

## 2018-12-09 LAB — LIPID PANEL
CHOL/HDL RATIO: 2
Cholesterol: 105 mg/dL (ref 0–200)
HDL: 48.6 mg/dL (ref >39.00)
LDL Cholesterol: 44 mg/dL (ref 0–99)
NonHDL: 56.72
Triglycerides: 65 mg/dL (ref 0.0–149.0)
VLDL: 13 mg/dL (ref 0.0–40.0)

## 2018-12-09 LAB — TSH: TSH: 1.74 u[IU]/mL (ref 0.35–4.50)

## 2018-12-09 LAB — HEMOGLOBIN A1C: Hgb A1c MFr Bld: 6.1 % (ref 4.6–6.5)

## 2018-12-09 LAB — VITAMIN D 25 HYDROXY (VIT D DEFICIENCY, FRACTURES): VITD: 20.57 ng/mL — ABNORMAL LOW (ref 30.00–100.00)

## 2018-12-09 MED ORDER — FERROUS SULFATE 325 (65 FE) MG PO TABS: 325.0000 mg | ORAL_TABLET | Freq: Every day | ORAL | 5 refills | Status: DC

## 2018-12-09 MED ORDER — VITAMIN D (ERGOCALCIFEROL) 1.25 MG (50000 UNIT) PO CAPS: 50000.0000 [IU] | ORAL_CAPSULE | ORAL | 0 refills | Status: DC

## 2019-01-05 ENCOUNTER — Emergency Department (HOSPITAL_COMMUNITY)
Admission: EM | Admit: 2019-01-05 | Discharge: 2019-01-05 | Disposition: A | Discharge status: Home / Self Care | Payer: BLUE CROSS/BLUE SHIELD | Attending: Emergency Medicine | Admitting: Emergency Medicine

## 2019-01-05 ENCOUNTER — Emergency Department (HOSPITAL_COMMUNITY): Payer: BLUE CROSS/BLUE SHIELD

## 2019-01-05 ENCOUNTER — Other Ambulatory Visit: Payer: Self-pay

## 2019-01-05 ENCOUNTER — Encounter (HOSPITAL_COMMUNITY): Payer: Self-pay | Admitting: Emergency Medicine

## 2019-01-05 DIAGNOSIS — R531 Weakness: Secondary | ICD-10-CM | POA: Insufficient documentation

## 2019-01-05 DIAGNOSIS — Z79899 Other long term (current) drug therapy: Secondary | ICD-10-CM | POA: Insufficient documentation

## 2019-01-05 DIAGNOSIS — E119 Type 2 diabetes mellitus without complications: Secondary | ICD-10-CM | POA: Diagnosis not present

## 2019-01-05 DIAGNOSIS — I10 Essential (primary) hypertension: Secondary | ICD-10-CM | POA: Diagnosis not present

## 2019-01-05 DIAGNOSIS — R079 Chest pain, unspecified: Secondary | ICD-10-CM | POA: Diagnosis not present

## 2019-01-05 DIAGNOSIS — Z7984 Long term (current) use of oral hypoglycemic drugs: Secondary | ICD-10-CM | POA: Insufficient documentation

## 2019-01-05 DIAGNOSIS — R0602 Shortness of breath: Secondary | ICD-10-CM | POA: Insufficient documentation

## 2019-01-05 DIAGNOSIS — R42 Dizziness and giddiness: Secondary | ICD-10-CM | POA: Diagnosis not present

## 2019-01-05 DIAGNOSIS — R0789 Other chest pain: Secondary | ICD-10-CM | POA: Diagnosis not present

## 2019-01-05 DIAGNOSIS — R457 State of emotional shock and stress, unspecified: Secondary | ICD-10-CM | POA: Diagnosis not present

## 2019-01-05 LAB — CBC WITH DIFFERENTIAL/PLATELET
Abs Immature Granulocytes: 0.03 10*3/uL (ref 0.00–0.07)
Basophils Absolute: 0 10*3/uL (ref 0.0–0.1)
Basophils Relative: 0 %
Eosinophils Absolute: 0.1 10*3/uL (ref 0.0–0.5)
Eosinophils Relative: 2 %
HCT: 32.6 % — ABNORMAL LOW (ref 36.0–46.0)
Hemoglobin: 9.2 g/dL — ABNORMAL LOW (ref 12.0–15.0)
Immature Granulocytes: 1 %
Lymphocytes Relative: 22 %
Lymphs Abs: 1.5 10*3/uL (ref 0.7–4.0)
MCH: 19.6 pg — ABNORMAL LOW (ref 26.0–34.0)
MCHC: 28.2 g/dL — ABNORMAL LOW (ref 30.0–36.0)
MCV: 69.5 fL — ABNORMAL LOW (ref 80.0–100.0)
Monocytes Absolute: 0.5 10*3/uL (ref 0.1–1.0)
Monocytes Relative: 7 %
Neutro Abs: 4.4 10*3/uL (ref 1.7–7.7)
Neutrophils Relative %: 68 %
Platelets: 508 10*3/uL — ABNORMAL HIGH (ref 150–400)
RBC: 4.69 MIL/uL (ref 3.87–5.11)
RDW: 20.8 % — ABNORMAL HIGH (ref 11.5–15.5)
WBC: 6.5 10*3/uL (ref 4.0–10.5)
nRBC: 0 % (ref 0.0–0.2)

## 2019-01-05 LAB — BASIC METABOLIC PANEL
Anion gap: 11 (ref 5–15)
BUN: 8 mg/dL (ref 6–20)
CO2: 20 mmol/L — ABNORMAL LOW (ref 22–32)
Calcium: 9.2 mg/dL (ref 8.9–10.3)
Chloride: 105 mmol/L (ref 98–111)
Creatinine, Ser: 0.66 mg/dL (ref 0.44–1.00)
GFR calc Af Amer: >60 mL/min (ref >60)
GFR calc non Af Amer: >60 mL/min (ref >60)
Glucose, Bld: 118 mg/dL — ABNORMAL HIGH (ref 70–99)
Potassium: 3.8 mmol/L (ref 3.5–5.1)
Sodium: 136 mmol/L (ref 135–145)

## 2019-01-05 LAB — I-STAT BETA HCG BLOOD, ED (MC, WL, AP ONLY): I-stat hCG, quantitative: <5 m[IU]/mL (ref <5)

## 2019-01-05 LAB — TROPONIN I: Troponin I: <0.03 ng/mL (ref <0.03)

## 2019-01-05 MED ORDER — ONDANSETRON HCL 4 MG/2ML IJ SOLN
4.0000 mg | Freq: Once | INTRAMUSCULAR | Status: AC
  Administered 2019-01-05: 4 mg via INTRAVENOUS
  Filled 2019-01-05: qty 2

## 2019-01-05 MED ORDER — ONDANSETRON 4 MG PO TBDP: 4.0000 mg | ORAL_TABLET | Freq: Three times a day (TID) | ORAL | 0 refills | Status: DC | PRN

## 2019-01-05 MED ORDER — SODIUM CHLORIDE 0.9 % IV BOLUS
1000.0000 mL | Freq: Once | INTRAVENOUS | Status: AC
  Administered 2019-01-05: 15:00:00 1000 mL via INTRAVENOUS

## 2019-01-05 NOTE — ED Notes (Signed)
After I placed the IV pt reported to me that she was beginning to feel light headed again.  I checked her BP and it was 171/90 with HR 85.  IV then flushed and pt asked about the normal saline flush as it increased her dizziness.  Pt anxious and tearful.  BP rechecked and at this time it is 164/85.  Attempted to reassure pt.  Call bell in reach.  Will continue to monitor.

## 2019-01-05 NOTE — ED Triage Notes (Signed)
Central chest discomfort  At 1145 and had some dizziness and felt syncopal bp 180/ 100 got 324 asa hurts to take a deep breath

## 2019-01-05 NOTE — ED Provider Notes (Cosign Needed)
[facility] EMERGENCY DEPARTMENT Provider Note   CSN: [id number] Arrival date & time: 01/05/19  1337    History   Chief Complaint Chief Complaint  Patient presents with   Chest Pain    HPI Sarah Faulkner is a 38 y.o. female.     HPI   38 year old female presents today with complaints of shortness of breath chest pressure and dizziness.  Patient notes that approximately 1145 (2 hours prior to arrival) she had a bowel movement.  She notes she moved back to her desk and developed chest pressure in the substernal region dizziness and shortness of breath.  She felt her heart was racing.  She felt as if she was going to pass out.  EMS was called.  She notes since EMS evaluation she has had improvement in symptoms.  She feels slightly anxious presently.  She notes minimal shortness of breath at this time.  She denies any respiratory symptoms, denies any fever.  Denies any personal cardiac history is a non-smoker and does not use drugs.  She denies any family cardiac history.  She denies any lower extremity swelling or edema history of DVT or PE or any significant risk factors for this.  She notes some minor dizziness with movements, none at rest no associated neurological deficits, she reports bilateral upper and lower remedy sensation strength and motor function is intact  Past Medical History:  Diagnosis Date   Anemia    Chicken pox    Depression    Diabetes mellitus without complication ([place])     Patient Active Problem List   Diagnosis Date Noted   Family history of breast cancer in mother 05/23/2017   Vitamin D deficiency 05/23/2017   Anemia, iron deficiency 07/21/2015   Menorrhagia 07/21/2015   Radicular low back pain 07/15/2015   Pelvic pain in female 07/29/2014   Birth control 04/29/2014   Screening for malignant neoplasm of the cervix 09/29/2013   Physical exam 09/29/2013   Diabetes mellitus type II, controlled, with no complications  ([place]) 75/06/2584   Morbid obesity ([place]) 12/16/2012   Pain in right shoulder 12/16/2012    Past Surgical History:  Procedure Laterality Date   CHOLECYSTECTOMY     gallbladder removed  2005     OB History   No obstetric history on file.      Home Medications    Prior to Admission medications   Medication Sig Start Date End Date Taking? Authorizing Provider  Cholecalciferol (D3-1000) 1000 UNITS tablet Take 2,000 Units by mouth daily.    [provider]  ferrous sulfate 325 (65 FE) MG tablet Take 1 tablet (325 mg total) by mouth daily with breakfast. 12/09/18   Midge Minium, MD  Lorcaserin HCl (BELVIQ) 10 MG TABS Take 1 tablet by mouth 2 (two) times daily. Patient not taking: Reported on 12/08/2018 06/12/18   Midge Minium, MD  metFORMIN (GLUCOPHAGE) 1000 MG tablet TAKE 1 TABLET(1000 MG) BY MOUTH TWICE DAILY WITH A MEAL 12/09/18   Midge Minium, MD  UNABLE TO FIND Diabetic Supplies:  Glucometer, test strips, lancets. Check glucose BID (fasting and 2 hours after eating). One month supply. Refills PRN. 07/17/11   [provider]  valACYclovir (VALTREX) 500 MG tablet TAKE 1 TABLET BY MOUTH EVERY DAY Patient not taking: Reported on 10/21/2017 12/15/14   Midge Minium, MD  Vitamin D, Ergocalciferol, (DRISDOL) 1.25 MG (50000 UT) CAPS capsule Take 1 capsule (50,000 Units total) by mouth every 7 (seven) days. 12/09/18  Midge Minium, MD    Family History Family History  Problem Relation Age of Onset   Cancer Mother        breast   Heart disease Maternal Grandmother    Stroke Maternal Grandmother    Hypertension Maternal Grandmother    Breast cancer Neg Hx     Social History Social History   Tobacco Use   Smoking status: Never Smoker   Smokeless tobacco: Never Used  Substance Use Topics   Alcohol use: No   Drug use: No     Allergies   Patient has no known allergies.   Review of Systems Review of Systems  All other  systems reviewed and are negative.    Physical Exam Updated Vital Signs BP (!) 164/85    Pulse 83    Temp 98.6 F (37 C) (Oral)    Resp 19    SpO2 100%   Physical Exam Vitals signs and nursing note reviewed.  Constitutional:      Appearance: She is well-developed.  HENT:     Head: Normocephalic and atraumatic.  Eyes:     General: No scleral icterus.       Right eye: No discharge.        Left eye: No discharge.     Conjunctiva/sclera: Conjunctivae normal.     Pupils: Pupils are equal, round, and reactive to light.  Neck:     Musculoskeletal: Normal range of motion.     Vascular: No JVD.     Trachea: No tracheal deviation.  Cardiovascular:     Rate and Rhythm: Normal rate.  Pulmonary:     Effort: Pulmonary effort is normal. No respiratory distress.     Breath sounds: No stridor. No wheezing, rhonchi or rales.  Chest:     Chest wall: No tenderness.  Neurological:     General: No focal deficit present.     Mental Status: She is alert and oriented to person, place, and time.     Cranial Nerves: No cranial nerve deficit.     Sensory: No sensory deficit.     Motor: No weakness.     Coordination: Coordination normal.  Psychiatric:        Behavior: Behavior normal.        Thought Content: Thought content normal.        Judgment: Judgment normal.      ED Treatments / Results  Labs (all labs ordered are listed, but only abnormal results are displayed) Labs Reviewed  CBC WITH DIFFERENTIAL/PLATELET - Abnormal; Notable for the following components:      Result Value   Hemoglobin 9.2 (*)    HCT 32.6 (*)    MCV 69.5 (*)    MCH 19.6 (*)    MCHC 28.2 (*)    RDW 20.8 (*)    Platelets 508 (*)    All other components within normal limits  BASIC METABOLIC PANEL - Abnormal; Notable for the following components:   CO2 20 (*)    Glucose, Bld 118 (*)    All other components within normal limits  TROPONIN I  I-STAT BETA HCG BLOOD, ED ([facility], [facility], AP ONLY)    EKG EKG  Interpretation  Date/Time:  Monday January 05 2019 13:45:00 EDT Ventricular Rate:  79 PR Interval:    QRS Duration: 83 QT Interval:  378 QTC Calculation: 434 R Axis:   77 Text Interpretation:  Sinus rhythm Probable left atrial enlargement Minimal ST depression, inferior leads Baseline wander in lead(s)  V5 Confirmed by Lennice Sites [phone]) on 01/05/2019 1:48:09 PM   Radiology No results found.  Procedures Procedures (including critical care time)  Medications Ordered in ED Medications  sodium chloride 0.9 % bolus 1,000 mL (1,000 mLs Intravenous New Bag/Given 01/05/19 1449)  ondansetron (ZOFRAN) injection 4 mg (4 mg Intravenous Given 01/05/19 1453)     Initial Impression / Assessment and Plan / ED Course  I have reviewed the triage vital signs and the nursing notes.  Pertinent labs & imaging results that were available during my care of the patient were reviewed by me and considered in my medical decision making (see chart for details).        Labs: CBC, BMP, i-STAT beta-hCG, troponin  Imaging: DG chest 2 view  Consults:  Therapeutics:  Discharge Meds:   Assessment/Plan: 38 year old female presents today with complaints of dizziness.  Patient having chest pressure dizziness which has improved from her initial evaluation.  She denies any chest pain, she has minor dizziness with movement.  Question dehydration, anxiety, vasovagal episode at work.  Patient was given a liter of fluid, basic laboratory evaluation will be performed here.  Anticipate given patient's symptoms have already begun to improve that she will be stable for outpatient management for this.  Patient care signed to oncoming provider pending laboratory analysis and disposition.   Final Clinical Impressions(s) / ED Diagnoses   Final diagnoses:  Weakness    ED Discharge Orders    None       Okey Regal, PA-C 01/05/19 1514

## 2019-01-05 NOTE — Discharge Instructions (Addendum)
You were seen in the emergency department for lightheadedness, chest pressure, nausea.  Your lab work was normal.  Your hemoglobin was 9.2 which is not significantly different from her previous values.  Chest x-ray, EKG and heart enzymes are normal.  We have ruled out the possibility of blood clot in your lungs as well.  We suspect your symptoms may be from dehydration or vasovagal episode.  Stay well-hydrated.  Use Zofran for nausea as needed.  Return to the ED if there is fainting episodes, vision changes or loss, persistent vomiting despite nausea medicine, chest pain or shortness of breath or palpitations with activity or exertion, lower extremity swelling or calf pain, difficulty with speech or balance or swallowing, loss of sensation or strength to your extremities, persistent or heavy vaginal bleeding.

## 2019-01-05 NOTE — ED Provider Notes (Signed)
1505: Hand off from previous EDPA at shift change pending labs, CXR, reassessment. See previous note for full details.  Pt here for SOB, palpitations, chest pressure, light-headedness/dizziness with movements. Onset 2 hr PTA. Low HEART score. PERC negative.  1530: Evaluated pt. Discussed lab working. She feels better. I ambulated her in the room, she had no dizziness, CP, SOB. Re-checked her vitals and she had no tachycardia or hypoxia.  En route to CXR.  Physical Exam  BP (!) 164/85   Pulse 83   Temp 98.6 F (37 C) (Oral)   Resp 19   SpO2 100%   Physical Exam Constitutional:      Appearance: She is well-developed. She is not toxic-appearing.  HENT:     Head: Normocephalic.     Right Ear: External ear normal.     Left Ear: External ear normal.     Nose: Nose normal.  Eyes:     Conjunctiva/sclera: Conjunctivae normal.  Neck:     Musculoskeletal: Full passive range of motion without pain.  Cardiovascular:     Rate and Rhythm: Normal rate.  Pulmonary:     Effort: Pulmonary effort is normal. No tachypnea or respiratory distress.  Musculoskeletal: Normal range of motion.  Skin:    General: Skin is warm and dry.     Capillary Refill: Capillary refill takes less than 2 seconds.  Neurological:     Mental Status: She is alert and oriented to person, place, and time.  Psychiatric:        Behavior: Behavior normal.        Thought Content: Thought content normal.     ED Course/Procedures   Clinical Course as of Jan 04 1553  Mon Jan 05, 2019  1529 Hemoglobin(!): 9.2 [CG]  1530 No significant change. Menstrual cycle began Friday and reports heavy bleeding for first few days. Known anemia.   HCT(!): 32.6 [CG]    Clinical Course User Index [CG] Kinnie Feil, PA-C    Procedures  MDM   9562: Labs, EKG, chest x-ray reviewed.  Remarkable as above.  Vital signs stable.  I personally ambulated patient and she felt significantly better after IV fluids.  Discussed symptoms may be  related to dehydration versus vasovagal episode.  Encouraged her to stay hydrated at home, rest.  Return precautions were discussed.  Patient is comfortable with this plan.       Kinnie Feil, PA-C 01/05/19 [place], [place], DO 01/05/19 [phone]

## 2019-01-05 NOTE — ED Notes (Signed)
Patient verbalized understanding of discharge instructions and denies any further needs or questions at this time. VS stable. Patient ambulatory with steady gait.  

## 2019-01-22 ENCOUNTER — Ambulatory Visit: Payer: Self-pay

## 2019-01-22 NOTE — Telephone Encounter (Signed)
FYI

## 2019-01-22 NOTE — Telephone Encounter (Signed)
Pt is scheduled for an appt on 01/23/19.

## 2019-01-22 NOTE — Telephone Encounter (Signed)
Pt called stating that sh was feeling very anxious.  She states that on the 13th she felt dizzy and anxious and called 911 and was seen in the hospital. She states that since then she has had anxious panic feeling that she will get sick and go into the hospital and die. She states that she is fearfull of the COVID-19 because she is diabetic. She states that she lives alone but has friends in the area.  Her family is in [place]. She is working form home and everything has been worse with anxiety. She denies suicide and homicidal thoughts.  She has trouble concentrating and sleeping.  She is tearful as we are speaking. Care advice read to patient. Pt verbalized understanding. Call transferred to office for appointment.  Reason for Disposition . [1] Depression AND [2] worsening (e.g.,sleeping poorly, less able to do activities of daily living)  Answer Assessment - Initial Assessment Questions 1. CONCERN: "What happened that made you call today?"     dizziness 2. ANXIETY SYMPTOM SCREENING: "Can you describe how you have been feeling?"  (e.g., tense, restless, panicky, anxious, keyed up, trouble sleeping, trouble concentrating)     panicky 3. ONSET: "How long have you been feeling this way?"    13 of April 4. RECURRENT: "Have you felt this way before?"  If yes: "What happened that time?" "What helped these feelings go away in the past?"      yes 5. RISK OF HARM - SUICIDAL IDEATION:  "Do you ever have thoughts of hurting or killing yourself?"  (e.g., yes, no, no but preoccupation with thoughts about death)   - INTENT:  "Do you have thoughts of hurting or killing yourself right NOW?" (e.g., yes, no, N/A)   - PLAN: "Do you have a specific plan for how you would do this?" (e.g., gun, knife, overdose, no plan, N/A)    No 6. RISK OF HARM - HOMICIDAL IDEATION:  "Do you ever have thoughts of hurting or killing someone else?"  (e.g., yes, no, no but preoccupation with thoughts about death)   - INTENT:  "Do  you have thoughts of hurting or killing someone right NOW?" (e.g., yes, no, N/A)   - PLAN: "Do you have a specific plan for how you would do this?" (e.g., gun, knife, no plan, N/A)      No 7. FUNCTIONAL IMPAIRMENT: "How have things been going for you overall in your life? Have you had any more difficulties than usual doing your normal daily activities?"  (e.g., better, same, worse; self-care, school, work, interactions)     Worse feels like she is going to go into the hospital and dying  From the [place] 8. SUPPORT: "Who is with you now?" "Who do you live with?" "Do you have family or friends nearby who you can talk to?"      Family is in [place] 9. THERAPIST: "Do you have a counselor or therapist? Name?"     Years ago  40. STRESSORS: "Has there been any new stress or recent changes in your life?"   Moved here   8 years has friends in the area. Feal stressed because isolation now and  11. CAFFEINE ABUSE: "Do you drink caffeinated beverages, and how much each day?" (e.g., coffee, tea, colas)      no 12. SUBSTANCE ABUSE: "Do you use any illegal drugs or alcohol?"       no 13. OTHER SYMPTOMS: "Do you have any other physical symptoms right now?" (e.g., chest pain, palpitations,  difficulty breathing, fever)       Dizzy, SOB , chest pain sometimes left side under breast 14. PREGNANCY: "Is there any chance you are pregnant?" "When was your last menstrual period?"       No last menses April 11th  Answer Assessment - Initial Assessment Questions 1. CONCERN: "What happened that made you call today?"     anxiety 2. DEPRESSION SYMPTOM SCREENING: "How are you feeling overall?" (e.g., decreased energy, increased sleeping or difficulty sleeping, difficulty concentrating, feelings of sadness, guilt, hopelessness, or worthlessness)    Difficult concentration, sadness hpless sleep 3. RISK OF HARM - SUICIDAL IDEATION:  "Do you ever have thoughts of hurting or killing yourself?"  (e.g., yes, no, no but preoccupation  with thoughts about death)   - INTENT:  "Do you have thoughts of hurting or killing yourself right NOW?" (e.g., yes, no, N/A)   - PLAN: "Do you have a specific plan for how you would do this?" (e.g., gun, knife, overdose, no plan, N/A)     No 4. RISK OF HARM - HOMICIDAL IDEATION:  "Do you ever have thoughts of hurting or killing someone else?"  (e.g., yes, no, no but preoccupation with thoughts about death)   - INTENT:  "Do you have thoughts of hurting or killing someone right NOW?" (e.g., yes, no, N/A)   - PLAN: "Do you have a specific plan for how you would do this?" (e.g., gun, knife, no plan, N/A)     No 5. FUNCTIONAL IMPAIRMENT: "How have things been going for you overall in your life? Have you had any more difficulties than usual doing your normal daily activities?"  (e.g., better, same, worse; self-care, school, work, interactions)    More difficult working from home isolation 6. SUPPORT: "Who is with you now?" "Who do you live with?" "Do you have family or friends nearby who you can talk to?"      Friends live near family in [place] 7. THERAPIST: "Do you have a counselor or therapist? Name?"    Years ago 46. STRESSORS: "Has there been any new stress or recent changes in your life?"     Work from home 9. DRUG ABUSE/ALCOHOL: "Do you drink alcohol or use any illegal drugs?"    no 10. OTHER: "Do you have any other health or medical symptoms right now?" (e.g., fever)       Dizziness SOB 11. PREGNANCY: "Is there any chance you are pregnant?" "When was your last menstrual period?"       No April 11  Protocols used: [place], ANXIETY AND PANIC ATTACK-A-AH

## 2019-01-23 ENCOUNTER — Encounter: Payer: Self-pay | Admitting: Family Medicine

## 2019-01-23 ENCOUNTER — Ambulatory Visit (INDEPENDENT_AMBULATORY_CARE_PROVIDER_SITE_OTHER): Payer: BLUE CROSS/BLUE SHIELD | Admitting: Family Medicine

## 2019-01-23 ENCOUNTER — Other Ambulatory Visit: Payer: Self-pay

## 2019-01-23 DIAGNOSIS — F32A Depression, unspecified: Secondary | ICD-10-CM

## 2019-01-23 DIAGNOSIS — F419 Anxiety disorder, unspecified: Secondary | ICD-10-CM

## 2019-01-23 DIAGNOSIS — F329 Major depressive disorder, single episode, unspecified: Secondary | ICD-10-CM

## 2019-01-23 MED ORDER — SERTRALINE HCL 25 MG PO TABS: 25.0000 mg | ORAL_TABLET | Freq: Every day | ORAL | 3 refills | Status: DC

## 2019-01-23 NOTE — Progress Notes (Signed)
Virtual Visit via Video   I connected with patient on 01/23/19 at  9:20 AM EDT by a video enabled telemedicine application and verified that I am speaking with the correct person using two identifiers.  Location patient: Home Location provider: [facility], Office Persons participating in the virtual visit: Patient, Provider, [employer] ([place])  I discussed the limitations of evaluation and management by telemedicine and the availability of in person appointments. The patient expressed understanding and agreed to proceed.  Subjective:   HPI:  Anxiety- pt ended up in ER on 4/13 w/ panic attack.  Pt is very fearful of COVID due to underlying medical issues.  Pt is starting to notice physical sxs- elevated BP, palpitations, SOB, dizziness.  Difficulty w/ focus, low motivation.  Pt signed up for EAP yesterday.    ROS:   See pertinent positives and negatives per HPI.  Patient Active Problem List   Diagnosis Date Noted  . Family history of breast cancer in mother 05/23/2017  . Vitamin D deficiency 05/23/2017  . Anemia, iron deficiency 07/21/2015  . Menorrhagia 07/21/2015  . Radicular low back pain 07/15/2015  . Pelvic pain in female 07/29/2014  . Birth control 04/29/2014  . Screening for malignant neoplasm of the cervix 09/29/2013  . Physical exam 09/29/2013  . Diabetes mellitus type II, controlled, with no complications ([place]) 57/32/2025  . Morbid obesity ([place]) 12/16/2012  . Pain in right shoulder 12/16/2012    Social History   Tobacco Use  . Smoking status: Never Smoker  . Smokeless tobacco: Never Used  Substance Use Topics  . Alcohol use: No    Current Outpatient Medications:  .  Cholecalciferol (D3-1000) 1000 UNITS tablet, Take 2,000 Units by mouth daily., Disp: , Rfl:  .  ferrous sulfate 325 (65 FE) MG tablet, Take 1 tablet (325 mg total) by mouth daily with breakfast., Disp: 30 tablet, Rfl: 5 .  metFORMIN (GLUCOPHAGE) 1000 MG tablet, TAKE 1 TABLET(1000 MG)  BY MOUTH TWICE DAILY WITH A MEAL, Disp: 180 tablet, Rfl: 0 .  UNABLE TO FIND, Diabetic Supplies:  Glucometer, test strips, lancets. Check glucose BID (fasting and 2 hours after eating). One month supply. Refills PRN., Disp: , Rfl:  .  valACYclovir (VALTREX) 500 MG tablet, TAKE 1 TABLET BY MOUTH EVERY DAY, Disp: 30 tablet, Rfl: 1 .  Vitamin D, Ergocalciferol, (DRISDOL) 1.25 MG (50000 UT) CAPS capsule, Take 1 capsule (50,000 Units total) by mouth every 7 (seven) days., Disp: 12 capsule, Rfl: 0 .  Lorcaserin HCl (BELVIQ) 10 MG TABS, Take 1 tablet by mouth 2 (two) times daily. (Patient not taking: Reported on 12/08/2018), Disp: 60 tablet, Rfl: 3 .  ondansetron (ZOFRAN ODT) 4 MG disintegrating tablet, Take 1 tablet (4 mg total) by mouth every 8 (eight) hours as needed for nausea or vomiting., Disp: 20 tablet, Rfl: 0  No Known Allergies  Objective:   Ht 5\' 4"  (1.626 m)   Wt (!) 337 lb (152.9 kg)   BMI 57.85 kg/m   AAOx3, NAD NCAT, EOMI Obese No obvious CN deficits Coloring WNL Pt is able to speak clearly, coherently without shortness of breath or increased work of breathing.  Thought process is linear.  Mood is appropriate.   Assessment and Plan:   Anxiety/Depression- new.  Severe enough that pt ended up in ER w/ panic attack.  Discussed counseling- pt signed up for EAP.  Also discussed medication.  Pt explained why she was hesitant but is agreeable to try a low dose medication  since her anxiety is impacting her ability to fxn at work and causing physical sxs.  Will start low dose Sertraline and monitor closely for improvement.  Pt expressed understanding and is in agreement w/ plan.   Annye Asa, MD 01/23/2019

## 2019-01-23 NOTE — Progress Notes (Signed)
I have discussed the procedure for the virtual visit with the patient who has given consent to proceed with assessment and treatment.   Emme Rosenau, CMA     

## 2019-02-11 ENCOUNTER — Encounter: Payer: Self-pay | Admitting: Family Medicine

## 2019-02-11 ENCOUNTER — Ambulatory Visit (INDEPENDENT_AMBULATORY_CARE_PROVIDER_SITE_OTHER): Payer: BLUE CROSS/BLUE SHIELD | Admitting: Family Medicine

## 2019-02-11 ENCOUNTER — Other Ambulatory Visit: Payer: Self-pay

## 2019-02-11 VITALS — Ht 64.0 in | Wt 333.1 lb

## 2019-02-11 DIAGNOSIS — F32A Depression, unspecified: Secondary | ICD-10-CM

## 2019-02-11 DIAGNOSIS — F329 Major depressive disorder, single episode, unspecified: Secondary | ICD-10-CM | POA: Diagnosis not present

## 2019-02-11 DIAGNOSIS — F419 Anxiety disorder, unspecified: Secondary | ICD-10-CM

## 2019-02-11 NOTE — Progress Notes (Signed)
I have discussed the procedure for the virtual visit with the patient who has given consent to proceed with assessment and treatment.   Jessica L Brodmerkel, CMA     

## 2019-02-11 NOTE — Progress Notes (Signed)
Virtual Visit via Video   I connected with patient on 02/11/19 at  8:20 AM EDT by a video enabled telemedicine application and verified that I am speaking with the correct person using two identifiers.  Location patient: Home Location provider: Acup[facility]urist, Office Persons participating in the virtual visit: Patient, Provider, [place] (Jess B)  I discussed the limitations of evaluation and management by telemedicine and the availability of in person appointments. The patient expressed understanding and agreed to proceed.  Subjective:   HPI:   Anxiety- pt reports she is having 'more good days than bad'.  Better than 'the month of April'.  Started counseling.  Pt feels that counseling is helpful.  Did not start Sertraline- wanted to try counseling first.  Is considering starting medication b/c on the days she does have anxiety 'it's really bad'.  'last week was probably the best week i've had'.    ROS:   See pertinent positives and negatives per HPI.  Patient Active Problem List   Diagnosis Date Noted  . Anxiety and depression 01/23/2019  . Family history of breast cancer in mother 05/23/2017  . Vitamin D deficiency 05/23/2017  . Anemia, iron deficiency 07/21/2015  . Menorrhagia 07/21/2015  . Radicular low back pain 07/15/2015  . Pelvic pain in female 07/29/2014  . Birth control 04/29/2014  . Screening for malignant neoplasm of the cervix 09/29/2013  . Physical exam 09/29/2013  . Diabetes mellitus type II, controlled, with no complications ([facility]) 32/67/1245  . Morbid obesity ([place]) 12/16/2012  . Pain in right shoulder 12/16/2012    Social History   Tobacco Use  . Smoking status: Never Smoker  . Smokeless tobacco: Never Used  Substance Use Topics  . Alcohol use: No    Current Outpatient Medications:  .  Cholecalciferol (D3-1000) 1000 UNITS tablet, Take 2,000 Units by mouth daily., Disp: , Rfl:  .  ferrous sulfate 325 (65 FE) MG tablet, Take 1 tablet (325 mg total) by  mouth daily with breakfast., Disp: 30 tablet, Rfl: 5 .  metFORMIN (GLUCOPHAGE) 1000 MG tablet, TAKE 1 TABLET(1000 MG) BY MOUTH TWICE DAILY WITH A MEAL, Disp: 180 tablet, Rfl: 0 .  UNABLE TO FIND, Diabetic Supplies:  Glucometer, test strips, lancets. Check glucose BID (fasting and 2 hours after eating). One month supply. Refills PRN., Disp: , Rfl:  .  valACYclovir (VALTREX) 500 MG tablet, TAKE 1 TABLET BY MOUTH EVERY DAY, Disp: 30 tablet, Rfl: 1 .  Vitamin D, Ergocalciferol, (DRISDOL) 1.25 MG (50000 UT) CAPS capsule, Take 1 capsule (50,000 Units total) by mouth every 7 (seven) days., Disp: 12 capsule, Rfl: 0 .  sertraline (ZOLOFT) 25 MG tablet, Take 1 tablet (25 mg total) by mouth daily. (Patient not taking: Reported on 02/11/2019), Disp: 30 tablet, Rfl: 3  No Known Allergies  Objective:   Ht 5\' 4"  (1.626 m)   Wt (!) 333 lb 2 oz (151.1 kg)   BMI 57.18 kg/m  AAOx3, NAD NCAT, EOMI No obvious CN deficits Obese Coloring WNL Pt is able to speak clearly, coherently without shortness of breath or increased work of breathing.  Thought process is linear.  Mood is appropriate.   Assessment and Plan:   Anxiety- pt feels better since starting counseling.  She feels that sxs are better and last week felt great, but this week is again struggling w/ anxiety.  Again discussed medication.  She has picked it up but is not yet taking it.  She plans to start.  Will follow.  Annye Asa, MD 02/11/2019

## 2019-02-23 ENCOUNTER — Encounter: Payer: Self-pay | Admitting: Family Medicine

## 2019-02-27 DIAGNOSIS — Z20828 Contact with and (suspected) exposure to other viral communicable diseases: Secondary | ICD-10-CM | POA: Diagnosis not present

## 2019-03-10 ENCOUNTER — Other Ambulatory Visit: Payer: Self-pay | Admitting: Family Medicine

## 2019-03-12 ENCOUNTER — Other Ambulatory Visit: Payer: Self-pay

## 2019-03-12 ENCOUNTER — Ambulatory Visit (INDEPENDENT_AMBULATORY_CARE_PROVIDER_SITE_OTHER): Payer: BC Managed Care – PPO | Admitting: Family Medicine

## 2019-03-12 ENCOUNTER — Encounter: Payer: Self-pay | Admitting: Family Medicine

## 2019-03-12 VITALS — Temp 96.3°F | Ht 64.0 in | Wt 333.0 lb

## 2019-03-12 DIAGNOSIS — F329 Major depressive disorder, single episode, unspecified: Secondary | ICD-10-CM | POA: Diagnosis not present

## 2019-03-12 DIAGNOSIS — F419 Anxiety disorder, unspecified: Secondary | ICD-10-CM

## 2019-03-12 DIAGNOSIS — F32A Depression, unspecified: Secondary | ICD-10-CM

## 2019-03-12 NOTE — Progress Notes (Signed)
   Virtual Visit via Video   I connected with patient on 03/12/19 at 10:30 AM EDT by a video enabled telemedicine application and verified that I am speaking with the correct person using two identifiers.  Location patient: Home Location provider: Acupuncturist, Office Persons participating in the virtual visit: Patient, Provider, [place] (Jess B)  I discussed the limitations of evaluation and management by telemedicine and the availability of in person appointments. The patient expressed understanding and agreed to proceed.  Subjective:   HPI:   Anxiety/Depression- 'a lot better than I was'.  Pt started Sertraline.  Feels medication is helpful.  Her anxiety/panicked moments have decreased considerably.  Continues in counseling.  Thinks this is also helpful.  Not interested in changing meds at this time  ROS:   See pertinent positives and negatives per HPI.  Patient Active Problem List   Diagnosis Date Noted  . Anxiety and depression 01/23/2019  . Family history of breast cancer in mother 05/23/2017  . Vitamin D deficiency 05/23/2017  . Anemia, iron deficiency 07/21/2015  . Menorrhagia 07/21/2015  . Radicular low back pain 07/15/2015  . Screening for malignant neoplasm of the cervix 09/29/2013  . Physical exam 09/29/2013  . Diabetes mellitus type II, controlled, with no complications ([place]) 82/50/5397  . Morbid obesity ([place]) 12/16/2012  . Pain in right shoulder 12/16/2012    Social History   Tobacco Use  . Smoking status: Never Smoker  . Smokeless tobacco: Never Used  Substance Use Topics  . Alcohol use: No    Current Outpatient Medications:  .  Cholecalciferol (D3-1000) 1000 UNITS tablet, Take 2,000 Units by mouth daily., Disp: , Rfl:  .  ferrous sulfate 325 (65 FE) MG tablet, Take 1 tablet (325 mg total) by mouth daily with breakfast., Disp: 30 tablet, Rfl: 5 .  metFORMIN (GLUCOPHAGE) 1000 MG tablet, TAKE 1 TABLET(1000 MG) BY MOUTH TWICE DAILY WITH A MEAL, Disp: 180  tablet, Rfl: 0 .  sertraline (ZOLOFT) 25 MG tablet, Take 1 tablet (25 mg total) by mouth daily., Disp: 30 tablet, Rfl: 3 .  UNABLE TO FIND, Diabetic Supplies:  Glucometer, test strips, lancets. Check glucose BID (fasting and 2 hours after eating). One month supply. Refills PRN., Disp: , Rfl:  .  valACYclovir (VALTREX) 500 MG tablet, TAKE 1 TABLET BY MOUTH EVERY DAY, Disp: 30 tablet, Rfl: 1  No Known Allergies  Objective:   Temp (!) 96.3 F (35.7 C) (Oral)   Ht 5\' 4"  (1.626 m)   Wt (!) 333 lb (151 kg)   BMI 57.16 kg/m   AAOx3, NAD NCAT, EOMI No obvious CN deficits Coloring WNL Pt is able to speak clearly, coherently without shortness of breath or increased work of breathing.  Thought process is linear.  Mood is appropriate.   Assessment and Plan:   Anxiety/depression- much improved since add Sertraline to her counseling sessions.  No need to change meds at this time.  Pt is doing great.  Will continue to follow.   Annye Asa, MD 03/12/2019

## 2019-03-12 NOTE — Progress Notes (Signed)
I have discussed the procedure for the virtual visit with the patient who has given consent to proceed with assessment and treatment.   Sertraline seems to be helping. Only complaint is pt has a slight headache.   Davis Gourd, CMA

## 2019-03-13 ENCOUNTER — Ambulatory Visit: Payer: BLUE CROSS/BLUE SHIELD | Admitting: Family Medicine

## 2019-03-25 ENCOUNTER — Ambulatory Visit: Payer: BLUE CROSS/BLUE SHIELD | Admitting: Family Medicine

## 2019-04-27 ENCOUNTER — Other Ambulatory Visit: Payer: Self-pay

## 2019-04-27 ENCOUNTER — Encounter (HOSPITAL_COMMUNITY): Payer: Self-pay

## 2019-04-27 DIAGNOSIS — Z79899 Other long term (current) drug therapy: Secondary | ICD-10-CM | POA: Insufficient documentation

## 2019-04-27 DIAGNOSIS — E119 Type 2 diabetes mellitus without complications: Secondary | ICD-10-CM | POA: Diagnosis not present

## 2019-04-27 DIAGNOSIS — Z7984 Long term (current) use of oral hypoglycemic drugs: Secondary | ICD-10-CM | POA: Insufficient documentation

## 2019-04-27 DIAGNOSIS — R42 Dizziness and giddiness: Secondary | ICD-10-CM | POA: Diagnosis not present

## 2019-04-27 NOTE — ED Triage Notes (Signed)
Pt complains of having two dizzy episodes, this morning and this evening, pt states that she has been eating and drinking normally, denies nausea, vomiting or diarrhea

## 2019-04-28 ENCOUNTER — Emergency Department (HOSPITAL_COMMUNITY): Payer: BC Managed Care – PPO

## 2019-04-28 ENCOUNTER — Emergency Department (HOSPITAL_COMMUNITY)
Admission: EM | Admit: 2019-04-28 | Discharge: 2019-04-28 | Disposition: A | Discharge status: Home / Self Care | Payer: BC Managed Care – PPO | Attending: Emergency Medicine | Admitting: Emergency Medicine

## 2019-04-28 DIAGNOSIS — R42 Dizziness and giddiness: Secondary | ICD-10-CM | POA: Diagnosis not present

## 2019-04-28 LAB — CBC WITH DIFFERENTIAL/PLATELET
Abs Immature Granulocytes: 0.04 10*3/uL (ref 0.00–0.07)
Basophils Absolute: 0 10*3/uL (ref 0.0–0.1)
Basophils Relative: 0 %
Eosinophils Absolute: 0.2 10*3/uL (ref 0.0–0.5)
Eosinophils Relative: 2 %
HCT: 33.3 % — ABNORMAL LOW (ref 36.0–46.0)
Hemoglobin: 9.5 g/dL — ABNORMAL LOW (ref 12.0–15.0)
Immature Granulocytes: 0 %
Lymphocytes Relative: 22 %
Lymphs Abs: 2.1 10*3/uL (ref 0.7–4.0)
MCH: 20.2 pg — ABNORMAL LOW (ref 26.0–34.0)
MCHC: 28.5 g/dL — ABNORMAL LOW (ref 30.0–36.0)
MCV: 70.9 fL — ABNORMAL LOW (ref 80.0–100.0)
Monocytes Absolute: 0.7 10*3/uL (ref 0.1–1.0)
Monocytes Relative: 7 %
Neutro Abs: 6.6 10*3/uL (ref 1.7–7.7)
Neutrophils Relative %: 69 %
Platelets: 507 10*3/uL — ABNORMAL HIGH (ref 150–400)
RBC: 4.7 MIL/uL (ref 3.87–5.11)
RDW: 16.4 % — ABNORMAL HIGH (ref 11.5–15.5)
WBC: 9.6 10*3/uL (ref 4.0–10.5)
nRBC: 0 % (ref 0.0–0.2)

## 2019-04-28 LAB — HCG, QUANTITATIVE, PREGNANCY: hCG, Beta Chain, Quant, S: <1 m[IU]/mL (ref <5)

## 2019-04-28 LAB — BASIC METABOLIC PANEL
Anion gap: 8 (ref 5–15)
BUN: 14 mg/dL (ref 6–20)
CO2: 22 mmol/L (ref 22–32)
Calcium: 8.6 mg/dL — ABNORMAL LOW (ref 8.9–10.3)
Chloride: 106 mmol/L (ref 98–111)
Creatinine, Ser: 0.74 mg/dL (ref 0.44–1.00)
GFR calc Af Amer: >60 mL/min (ref >60)
GFR calc non Af Amer: >60 mL/min (ref >60)
Glucose, Bld: 105 mg/dL — ABNORMAL HIGH (ref 70–99)
Potassium: 4.3 mmol/L (ref 3.5–5.1)
Sodium: 136 mmol/L (ref 135–145)

## 2019-04-28 MED ORDER — MECLIZINE HCL 25 MG PO TABS: 25.0000 mg | ORAL_TABLET | Freq: Three times a day (TID) | ORAL | 0 refills | Status: DC | PRN

## 2019-04-28 MED ORDER — MECLIZINE HCL 25 MG PO TABS
25.0000 mg | ORAL_TABLET | Freq: Once | ORAL | Status: AC
  Administered 2019-04-28: 01:00:00 25 mg via ORAL
  Filled 2019-04-28: qty 1

## 2019-04-28 NOTE — ED Notes (Signed)
Pt reports feeling dizzy since 4AM on 04/27/19.

## 2019-04-28 NOTE — ED Provider Notes (Signed)
[facility] DEPT Provider Note   CSN: [id number] Arrival date & time: 04/27/19  2140     History   Chief Complaint No chief complaint on file.   HPI Sarah Faulkner is a 38 y.o. female.     Patient is a 38 year old female with history of diabetes, depression, obesity.  She presents today for evaluation of headache and dizziness.  Patient states she woke from sleep this morning with the acute onset of a spinning sensation.  This lasted approximately 30 to 45 seconds, then resolved.  Later this evening, the patient experienced a similar episode which has lasted longer and is persisting.  She describes headache and worsening dizziness when she moves or sits up.  She also describes some discomfort in her left ear.  She denies any chest pain or difficulty breathing.  The history is provided by the patient.    Past Medical History:  Diagnosis Date  . Anemia   . Chicken pox   . Depression   . Diabetes mellitus without complication [facility])     Patient Active Problem List   Diagnosis Date Noted  . Anxiety and depression 01/23/2019  . Family history of breast cancer in mother 05/23/2017  . Vitamin D deficiency 05/23/2017  . Anemia, iron deficiency 07/21/2015  . Menorrhagia 07/21/2015  . Radicular low back pain 07/15/2015  . Screening for malignant neoplasm of the cervix 09/29/2013  . Physical exam 09/29/2013  . Diabetes mellitus type II, controlled, with no complications ([place]) 18/29/9371  . Morbid obesity ([place]) 12/16/2012  . Pain in right shoulder 12/16/2012    Past Surgical History:  Procedure Laterality Date  . CHOLECYSTECTOMY    . gallbladder removed  2005     OB History   No obstetric history on file.      Home Medications    Prior to Admission medications   Medication Sig Start Date End Date Taking? Authorizing Provider  Cholecalciferol (D3-1000) 1000 UNITS tablet Take 2,000 Units by mouth daily.    [provider]   ferrous sulfate 325 (65 FE) MG tablet Take 1 tablet (325 mg total) by mouth daily with breakfast. 12/09/18   Midge Minium, MD  metFORMIN (GLUCOPHAGE) 1000 MG tablet TAKE 1 TABLET(1000 MG) BY MOUTH TWICE DAILY WITH A MEAL 03/10/19   Midge Minium, MD  sertraline (ZOLOFT) 25 MG tablet Take 1 tablet (25 mg total) by mouth daily. 01/23/19   Midge Minium, MD  UNABLE TO FIND Diabetic Supplies:  Glucometer, test strips, lancets. Check glucose BID (fasting and 2 hours after eating). One month supply. Refills PRN. 07/17/11   [provider]  valACYclovir (VALTREX) 500 MG tablet TAKE 1 TABLET BY MOUTH EVERY DAY 12/15/14   Midge Minium, MD    Family History Family History  Problem Relation Age of Onset  . Cancer Mother        breast  . Heart disease Maternal Grandmother   . Stroke Maternal Grandmother   . Hypertension Maternal Grandmother   . Breast cancer Neg Hx     Social History Social History   Tobacco Use  . Smoking status: Never Smoker  . Smokeless tobacco: Never Used  Substance Use Topics  . Alcohol use: No  . Drug use: No     Allergies   Patient has no known allergies.   Review of Systems Review of Systems  All other systems reviewed and are negative.    Physical Exam Updated Vital Signs BP Marland Kitchen)  173/111 (BP Location: Right Arm)   Pulse 69   Temp 98.7 F (37.1 C) (Oral)   Resp 15   LMP 04/07/2019   SpO2 100%   Physical Exam Vitals signs and nursing note reviewed.  Constitutional:      General: She is not in acute distress.    Appearance: She is well-developed. She is not diaphoretic.  HENT:     Head: Normocephalic and atraumatic.     Right Ear: Tympanic membrane normal.     Left Ear: Tympanic membrane normal.  Eyes:     Extraocular Movements: Extraocular movements intact.     Pupils: Pupils are equal, round, and reactive to light.     Comments: There is no nystagmus.  Neck:     Musculoskeletal: Normal range of motion and neck  supple.  Cardiovascular:     Rate and Rhythm: Normal rate and regular rhythm.     Heart sounds: No murmur. No friction rub. No gallop.   Pulmonary:     Effort: Pulmonary effort is normal. No respiratory distress.     Breath sounds: Normal breath sounds. No wheezing.  Abdominal:     General: Bowel sounds are normal. There is no distension.     Palpations: Abdomen is soft.     Tenderness: There is no abdominal tenderness.  Musculoskeletal: Normal range of motion.  Skin:    General: Skin is warm and dry.  Neurological:     General: No focal deficit present.     Mental Status: She is alert and oriented to person, place, and time.     Cranial Nerves: No cranial nerve deficit.     Sensory: No sensory deficit.     Motor: No weakness.     Coordination: Coordination normal.      ED Treatments / Results  Labs (all labs ordered are listed, but only abnormal results are displayed) Labs Reviewed  BASIC METABOLIC PANEL  CBC WITH DIFFERENTIAL/PLATELET  I-STAT BETA HCG BLOOD, ED ([facility], [facility], [facility] ONLY)    EKG None  Radiology No results found.  Procedures Procedures (including critical care time)  Medications Ordered in ED Medications  meclizine (ANTIVERT) tablet 25 mg (has no administration in time range)     Initial Impression / Assessment and Plan / ED Course  I have reviewed the triage vital signs and the nursing notes.  Pertinent labs & imaging results that were available during my care of the patient were reviewed by me and considered in my medical decision making (see chart for details).  Patient presenting here with complaints of dizziness that has occurred episodically throughout the day.  She is neurologically intact and laboratory studies are unremarkable.  CT scan of the head is also unremarkable.  I highly suspect that this is a peripheral vertigo due to the worsening with movement and change in position.  She was given meclizine and appears to be feeling better.  At this  point, I feel as though patient is appropriate for discharge.  She was initially hypertensive upon presentation, however this has since improved significantly.  Patient will be advised to keep an eye on her blood pressures at home and return to the ER if symptoms worsen or change.  Final Clinical Impressions(s) / ED Diagnoses   Final diagnoses:  None    ED Discharge Orders    None       Veryl Speak, MD 04/28/19 [phone]

## 2019-04-28 NOTE — ED Notes (Signed)
Pt wheeled back to room due to dizziness and pt placed on monitor. Primary RN Katie aware of pt's bp at time of placement in room 22. Pt has not dressed into a gown at this time due to waiting for gown from impatient.

## 2019-04-28 NOTE — Discharge Instructions (Addendum)
Meclizine as prescribed as needed for dizziness.  Keep a record of your blood pressures at home and take this with you to your next doctor's appointment.  Return to the emergency department if you develop severe headache, worsening dizziness, weakness, or other new and concerning symptoms.

## 2019-05-15 ENCOUNTER — Encounter: Payer: Self-pay | Admitting: Physician Assistant

## 2019-05-15 ENCOUNTER — Other Ambulatory Visit: Payer: Self-pay

## 2019-05-15 ENCOUNTER — Ambulatory Visit (INDEPENDENT_AMBULATORY_CARE_PROVIDER_SITE_OTHER): Payer: BC Managed Care – PPO | Admitting: Physician Assistant

## 2019-05-15 VITALS — BP 137/103 | HR 69

## 2019-05-15 DIAGNOSIS — R42 Dizziness and giddiness: Secondary | ICD-10-CM | POA: Diagnosis not present

## 2019-05-15 DIAGNOSIS — D509 Iron deficiency anemia, unspecified: Secondary | ICD-10-CM | POA: Diagnosis not present

## 2019-05-15 NOTE — Progress Notes (Signed)
I have discussed the procedure for the virtual visit with the patient who has given consent to proceed with assessment and treatment.   Renatha Rosen S Edgar Reisz, CMA     

## 2019-05-15 NOTE — Progress Notes (Signed)
Virtual Visit via Video   I connected with patient on 05/15/19 at  4:00 PM EDT by a video enabled telemedicine application and verified that I am speaking with the correct person using two identifiers.  Location patient: Home Location provider: [facility], Office Persons participating in the virtual visit: Patient, Provider, [place] ([name])  I discussed the limitations of evaluation and management by telemedicine and the availability of in person appointments. The patient expressed understanding and agreed to proceed.  Subjective:   HPI:   Patient presents via [facility]Me today c/o 2 weeks of intermittent dizziness described as the room spinning when she changes position. Has had 2 episode of this.   Occurred during the day. Sitting at desk working when she was leaning forward noted feeling like the room was going to start spinning. Denies lightheadedness, nausea/vomiting, hot flash, chest pain, SOB. Some increased heart rate after it happened but this quickly resolved. Notes over the past few days she has been doping more coffee but otherwise no change to diet. Has been keeping well hydrated. Denies noting any extra stressors. Was seen a few weeks ago during the first episode at the ER. Workup included labs which revealed continued significant anemia but otherwise unremarkable. Notes she stopped her Sertraline after weaning off of it in early July. Ferrous sulfate 325 mg once daily -- denies issue with constipation taking that medication.   Feels fine presently.  ROS:   See pertinent positives and negatives per HPI.  Patient Active Problem List   Diagnosis Date Noted  . Anxiety and depression 01/23/2019  . Family history of breast cancer in mother 05/23/2017  . Vitamin D deficiency 05/23/2017  . Anemia, iron deficiency 07/21/2015  . Menorrhagia 07/21/2015  . Radicular low back pain 07/15/2015  . Screening for malignant neoplasm of the cervix 09/29/2013  . Physical exam  09/29/2013  . Diabetes mellitus type II, controlled, with no complications ([place]) 84/69/6295  . Morbid obesity ([place]) 12/16/2012  . Pain in right shoulder 12/16/2012    Social History   Tobacco Use  . Smoking status: Never Smoker  . Smokeless tobacco: Never Used  Substance Use Topics  . Alcohol use: No    Current Outpatient Medications:  .  Cholecalciferol (D3-1000) 1000 UNITS tablet, Take 2,000 Units by mouth daily., Disp: , Rfl:  .  ferrous sulfate 325 (65 FE) MG tablet, Take 1 tablet (325 mg total) by mouth daily with breakfast., Disp: 30 tablet, Rfl: 5 .  meclizine (ANTIVERT) 25 MG tablet, Take 1 tablet (25 mg total) by mouth 3 (three) times daily as needed for dizziness., Disp: 12 tablet, Rfl: 0 .  metFORMIN (GLUCOPHAGE) 1000 MG tablet, TAKE 1 TABLET(1000 MG) BY MOUTH TWICE DAILY WITH A MEAL, Disp: 180 tablet, Rfl: 0 .  sertraline (ZOLOFT) 25 MG tablet, Take 1 tablet (25 mg total) by mouth daily., Disp: 30 tablet, Rfl: 3 .  UNABLE TO FIND, Diabetic Supplies:  Glucometer, test strips, lancets. Check glucose BID (fasting and 2 hours after eating). One month supply. Refills PRN., Disp: , Rfl:  .  valACYclovir (VALTREX) 500 MG tablet, TAKE 1 TABLET BY MOUTH EVERY DAY, Disp: 30 tablet, Rfl: 1  No Known Allergies  Objective:   There were no vitals taken for this visit.  Patient is well-developed, well-nourished in no acute distress.  Resting comfortably at home.  Head is normocephalic, atraumatic.  No labored breathing.  Speech is clear and coherent with logical content.  Patient is alert and oriented at  baseline.   Assessment and Plan:   1. Iron deficiency anemia, unspecified iron deficiency anemia type 2. Episodic lightheadedness/Vertigo Asymptomatic at present episodes of BPPV versus orthostasis due to significant iron deficiency. Per EMR review she has been quite anemic for some time. Will have her increase iron to BID dosing for now. Have her come in Monday AM for labs.  Supportive measures and OTC medications reviewed. ER precautions reviewed with patient. Have a low threshold for this since she was seen via Video today and not in-person for examination.  - CBC w/Diff; Future - Iron; Future - TSH; Future - Comp Met (CMET); Future    Leeanne Rio, PA-C 05/15/2019

## 2019-05-18 ENCOUNTER — Ambulatory Visit (INDEPENDENT_AMBULATORY_CARE_PROVIDER_SITE_OTHER): Payer: BC Managed Care – PPO

## 2019-05-18 ENCOUNTER — Encounter (HOSPITAL_COMMUNITY): Payer: Self-pay | Admitting: *Deleted

## 2019-05-18 ENCOUNTER — Emergency Department (HOSPITAL_COMMUNITY): Payer: BC Managed Care – PPO

## 2019-05-18 ENCOUNTER — Emergency Department (HOSPITAL_COMMUNITY)
Admission: EM | Admit: 2019-05-18 | Discharge: 2019-05-18 | Disposition: A | Discharge status: Home / Self Care | Payer: BC Managed Care – PPO | Attending: Emergency Medicine | Admitting: Emergency Medicine

## 2019-05-18 ENCOUNTER — Other Ambulatory Visit: Payer: Self-pay

## 2019-05-18 DIAGNOSIS — R42 Dizziness and giddiness: Secondary | ICD-10-CM

## 2019-05-18 DIAGNOSIS — Z7984 Long term (current) use of oral hypoglycemic drugs: Secondary | ICD-10-CM | POA: Diagnosis not present

## 2019-05-18 DIAGNOSIS — R51 Headache: Secondary | ICD-10-CM | POA: Diagnosis not present

## 2019-05-18 DIAGNOSIS — R072 Precordial pain: Secondary | ICD-10-CM | POA: Insufficient documentation

## 2019-05-18 DIAGNOSIS — E119 Type 2 diabetes mellitus without complications: Secondary | ICD-10-CM | POA: Insufficient documentation

## 2019-05-18 DIAGNOSIS — R0789 Other chest pain: Secondary | ICD-10-CM | POA: Diagnosis not present

## 2019-05-18 DIAGNOSIS — R0602 Shortness of breath: Secondary | ICD-10-CM | POA: Diagnosis not present

## 2019-05-18 DIAGNOSIS — Z6841 Body Mass Index (BMI) 40.0 and over, adult: Secondary | ICD-10-CM | POA: Insufficient documentation

## 2019-05-18 DIAGNOSIS — E669 Obesity, unspecified: Secondary | ICD-10-CM | POA: Diagnosis not present

## 2019-05-18 DIAGNOSIS — D509 Iron deficiency anemia, unspecified: Secondary | ICD-10-CM | POA: Diagnosis not present

## 2019-05-18 DIAGNOSIS — R079 Chest pain, unspecified: Secondary | ICD-10-CM | POA: Diagnosis not present

## 2019-05-18 DIAGNOSIS — R0689 Other abnormalities of breathing: Secondary | ICD-10-CM | POA: Diagnosis not present

## 2019-05-18 LAB — COMPREHENSIVE METABOLIC PANEL
ALT: 9 U/L (ref 0–35)
AST: 9 U/L (ref 0–37)
Albumin: 3.9 g/dL (ref 3.5–5.2)
Alkaline Phosphatase: 46 U/L (ref 39–117)
BUN: 8 mg/dL (ref 6–23)
CO2: 26 mEq/L (ref 19–32)
Calcium: 9.2 mg/dL (ref 8.4–10.5)
Chloride: 102 mEq/L (ref 96–112)
Creatinine, Ser: 0.62 mg/dL (ref 0.40–1.20)
GFR: 130.35 mL/min (ref >60.00)
Glucose, Bld: 103 mg/dL — ABNORMAL HIGH (ref 70–99)
Potassium: 4.6 mEq/L (ref 3.5–5.1)
Sodium: 137 mEq/L (ref 135–145)
Total Bilirubin: 0.3 mg/dL (ref 0.2–1.2)
Total Protein: 7 g/dL (ref 6.0–8.3)

## 2019-05-18 LAB — CBC WITH DIFFERENTIAL/PLATELET
Basophils Absolute: 0.1 10*3/uL (ref 0.0–0.1)
Basophils Relative: 1 % (ref 0.0–3.0)
Eosinophils Absolute: 0.1 10*3/uL (ref 0.0–0.7)
Eosinophils Relative: 1.5 % (ref 0.0–5.0)
HCT: 29.2 % — ABNORMAL LOW (ref 36.0–46.0)
Hemoglobin: 9.1 g/dL — ABNORMAL LOW (ref 12.0–15.0)
Lymphocytes Relative: 21.8 % (ref 12.0–46.0)
Lymphs Abs: 1.6 10*3/uL (ref 0.7–4.0)
MCHC: 31 g/dL (ref 30.0–36.0)
MCV: 66.3 fl — ABNORMAL LOW (ref 78.0–100.0)
Monocytes Absolute: 0.5 10*3/uL (ref 0.1–1.0)
Monocytes Relative: 6.5 % (ref 3.0–12.0)
Neutro Abs: 5 10*3/uL (ref 1.4–7.7)
Neutrophils Relative %: 69.2 % (ref 43.0–77.0)
Platelets: 533 10*3/uL — ABNORMAL HIGH (ref 150.0–400.0)
RBC: 4.41 Mil/uL (ref 3.87–5.11)
RDW: 17.6 % — ABNORMAL HIGH (ref 11.5–15.5)
WBC: 7.3 10*3/uL (ref 4.0–10.5)

## 2019-05-18 LAB — BASIC METABOLIC PANEL
Anion gap: 10 (ref 5–15)
BUN: 7 mg/dL (ref 6–20)
CO2: 22 mmol/L (ref 22–32)
Calcium: 8.7 mg/dL — ABNORMAL LOW (ref 8.9–10.3)
Chloride: 105 mmol/L (ref 98–111)
Creatinine, Ser: 0.8 mg/dL (ref 0.44–1.00)
GFR calc Af Amer: >60 mL/min (ref >60)
GFR calc non Af Amer: >60 mL/min (ref >60)
Glucose, Bld: 116 mg/dL — ABNORMAL HIGH (ref 70–99)
Potassium: 4.2 mmol/L (ref 3.5–5.1)
Sodium: 137 mmol/L (ref 135–145)

## 2019-05-18 LAB — CBC
HCT: 31.4 % — ABNORMAL LOW (ref 36.0–46.0)
Hemoglobin: 8.9 g/dL — ABNORMAL LOW (ref 12.0–15.0)
MCH: 20 pg — ABNORMAL LOW (ref 26.0–34.0)
MCHC: 28.3 g/dL — ABNORMAL LOW (ref 30.0–36.0)
MCV: 70.7 fL — ABNORMAL LOW (ref 80.0–100.0)
Platelets: 481 10*3/uL — ABNORMAL HIGH (ref 150–400)
RBC: 4.44 MIL/uL (ref 3.87–5.11)
RDW: 16.8 % — ABNORMAL HIGH (ref 11.5–15.5)
WBC: 7.7 10*3/uL (ref 4.0–10.5)
nRBC: 0 % (ref 0.0–0.2)

## 2019-05-18 LAB — TROPONIN I (HIGH SENSITIVITY)
Troponin I (High Sensitivity): 4 ng/L (ref <18)
Troponin I (High Sensitivity): 4 ng/L (ref <18)

## 2019-05-18 LAB — IRON: Iron: 19 ug/dL — ABNORMAL LOW (ref 42–145)

## 2019-05-18 LAB — BRAIN NATRIURETIC PEPTIDE: B Natriuretic Peptide: 102 pg/mL — ABNORMAL HIGH (ref 0.0–100.0)

## 2019-05-18 MED ORDER — SODIUM CHLORIDE 0.9 % IV BOLUS (SEPSIS)
1000.0000 mL | Freq: Once | INTRAVENOUS | Status: AC
  Administered 2019-05-18: 1000 mL via INTRAVENOUS

## 2019-05-18 MED ORDER — SODIUM CHLORIDE 0.9% FLUSH
3.0000 mL | Freq: Once | INTRAVENOUS | Status: AC
  Administered 2019-05-18: 3 mL via INTRAVENOUS

## 2019-05-18 NOTE — ED Triage Notes (Signed)
Patient presents to ed via [facility] states she started having some mild sob and chest pain yest am, last pm while in bed became very dizzy , states she was in the ED Aug. 4th for same and was dx. With postural vertigo, had a virtual visit with her pcp on fri and was suppose to go into the office today for further testing. Ems gave patient. 324 mg asa and ntg x 2 chest pian was 1/10 after 2nd NTG> patient is alert and oriented, tearful states " I dont want to die"

## 2019-05-18 NOTE — ED Notes (Signed)
Patient transported to X-ray 

## 2019-05-18 NOTE — ED Notes (Signed)
Patient verbalizes understanding of discharge instructions. Opportunity for questioning and answers were provided. Armband removed by staff, pt discharged from ED.  

## 2019-05-18 NOTE — ED Notes (Signed)
Pt ambulated with steady gait and no c/o

## 2019-05-18 NOTE — ED Notes (Signed)
Pt is NSR on monitor 

## 2019-05-18 NOTE — ED Provider Notes (Signed)
[facility] EMERGENCY DEPARTMENT Provider Note   CSN: [id number] Arrival date & time: 05/18/19  [id number]     History   Chief Complaint Chief Complaint  Patient presents with  . Chest Pain  . Shortness of Breath    HPI Sarah Faulkner is a 38 y.o. female.  HPI: A 38 year old patient with a history of treated diabetes and obesity presents for evaluation of chest pain. Initial onset of pain was approximately 3-6 hours ago. The patient's chest pain is described as heaviness/pressure/tightness and is not worse with exertion. The patient's chest pain is middle- or left-sided, is not well-localized, is not sharp and does radiate to the arms/jaw/neck. The patient does not complain of nausea and denies diaphoresis. The patient has no history of stroke, has no history of peripheral artery disease, has not smoked in the past 90 days, has no relevant family history of coronary artery disease (first degree relative at less than age 23), is not hypertensive and has no history of hypercholesterolemia.   The history is provided by the patient.  Chest Pain Pain location:  Substernal area and L chest Pain quality: pressure   Pain severity:  Moderate Onset quality:  Sudden Timing:  Constant Progression:  Improving Chronicity:  New Relieved by:  Nitroglycerin and aspirin Worsened by:  Nothing Associated symptoms: dizziness and shortness of breath   Associated symptoms: no cough, no fever, no syncope and no vomiting   Risk factors: diabetes mellitus   Risk factors: no birth control, no coronary artery disease, no prior DVT/PE and no smoking   Shortness of Breath Associated symptoms: chest pain   Associated symptoms: no cough, no fever, no syncope and no vomiting    Patient presents for chest pain shortness of breath.  She reports she woke up with chest pain.  She also reports dizziness which she has had before.  She reports the room is spinning.  No focal weakness.  No syncope.   She is able to ambulate. She has been seen in the ER previously for vertigo, and this was to have PCP follow-up. No pleuritic chest pain reported Past Medical History:  Diagnosis Date  . Anemia   . Chicken pox   . Depression   . Diabetes mellitus without complication [facility])     Patient Active Problem List   Diagnosis Date Noted  . Anxiety and depression 01/23/2019  . Family history of breast cancer in mother 05/23/2017  . Vitamin D deficiency 05/23/2017  . Anemia, iron deficiency 07/21/2015  . Menorrhagia 07/21/2015  . Radicular low back pain 07/15/2015  . Screening for malignant neoplasm of the cervix 09/29/2013  . Physical exam 09/29/2013  . Diabetes mellitus type II, controlled, with no complications ([place]) 0000000  . Morbid obesity ([place]) 12/16/2012  . Pain in right shoulder 12/16/2012    Past Surgical History:  Procedure Laterality Date  . CHOLECYSTECTOMY    . gallbladder removed  2005     OB History   No obstetric history on file.      Home Medications    Prior to Admission medications   Medication Sig Start Date End Date Taking? Authorizing Provider  Cholecalciferol (D3-1000) 1000 UNITS tablet Take 2,000 Units by mouth daily.    [provider]  ferrous sulfate 325 (65 FE) MG tablet Take 1 tablet (325 mg total) by mouth daily with breakfast. 12/09/18   Midge Minium, MD  metFORMIN (GLUCOPHAGE) 1000 MG tablet TAKE 1 TABLET(1000 MG) BY MOUTH TWICE DAILY  WITH A MEAL 03/10/19   Midge Minium, MD  sertraline (ZOLOFT) 25 MG tablet Take 1 tablet (25 mg total) by mouth daily. Patient not taking: Reported on 05/15/2019 01/23/19   Midge Minium, MD  UNABLE TO FIND Diabetic Supplies:  Glucometer, test strips, lancets. Check glucose BID (fasting and 2 hours after eating). One month supply. Refills PRN. 07/17/11   [provider]    Family History Family History  Problem Relation Age of Onset  . Cancer Mother        breast  . Heart  disease Maternal Grandmother   . Stroke Maternal Grandmother   . Hypertension Maternal Grandmother   . Breast cancer Neg Hx     Social History Social History   Tobacco Use  . Smoking status: Never Smoker  . Smokeless tobacco: Never Used  Substance Use Topics  . Alcohol use: No  . Drug use: No     Allergies   Patient has no known allergies.   Review of Systems Review of Systems  Constitutional: Negative for fever.  Respiratory: Positive for shortness of breath. Negative for cough.   Cardiovascular: Positive for chest pain. Negative for syncope.  Gastrointestinal: Negative for vomiting.  Genitourinary: Positive for vaginal bleeding.       She reports heavy menstrual cycle  Neurological: Positive for dizziness.  Psychiatric/Behavioral: The patient is nervous/anxious.   All other systems reviewed and are negative.    Physical Exam Updated Vital Signs BP (!) 155/90   Pulse 79   Temp 98.7 F (37.1 C) (Oral)   Resp 20   Ht 1.626 m (5\' 4" )   Wt (!) 149.7 kg   SpO2 99%   BMI 56.64 kg/m   Physical Exam CONSTITUTIONAL: Well developed/well nourished, anxious HEAD: Normocephalic/atraumatic EYES: EOMI/PERRL ENMT: Mucous membranes moist NECK: supple no meningeal signs SPINE/BACK:entire spine nontender CV: S1/S2 noted, no murmurs/rubs/gallops noted LUNGS: Lungs are clear to auscultation bilaterally, no apparent distress ABDOMEN: soft, nontender, no rebound or guarding, bowel sounds noted throughout abdomen GU:no cva tenderness NEURO: Pt is awake/alert/appropriate, moves all extremitiesx4.  No facial droop.  No focal weakness.   EXTREMITIES: pulses normal/equal, full ROM, no calf tenderness or edema SKIN: warm, color normal PSYCH: Anxious  ED Treatments / Results  Labs (all labs ordered are listed, but only abnormal results are displayed) Labs Reviewed  BASIC METABOLIC PANEL - Abnormal; Notable for the following components:      Result Value   Glucose, Bld 116  (*)    Calcium 8.7 (*)    All other components within normal limits  CBC - Abnormal; Notable for the following components:   Hemoglobin 8.9 (*)    HCT 31.4 (*)    MCV 70.7 (*)    MCH 20.0 (*)    MCHC 28.3 (*)    RDW 16.8 (*)    Platelets 481 (*)    All other components within normal limits  BRAIN NATRIURETIC PEPTIDE - Abnormal; Notable for the following components:   B Natriuretic Peptide 102.0 (*)    All other components within normal limits  I-STAT BETA HCG BLOOD, ED ([facility], [facility], [facility] ONLY)  TROPONIN I (HIGH SENSITIVITY)  TROPONIN I (HIGH SENSITIVITY)    EKG EKG Interpretation  Date/Time:  Monday May 18 2019 04:41:24 EDT Ventricular Rate:  87 PR Interval:    QRS Duration: 88 QT Interval:  367 QTC Calculation: 442 R Axis:   68 Text Interpretation:  Sinus rhythm Left atrial enlargement No significant change since  last tracing Confirmed by Ripley Fraise [phone]) on 05/18/2019 4:49:45 AM   Radiology Dg Chest 2 View  Result Date: 05/18/2019 CLINICAL DATA:  Shortness of breath EXAM: CHEST - 2 VIEW COMPARISON:  01/05/2019 FINDINGS: Normal heart size and mediastinal contours. No acute infiltrate or edema. No effusion or pneumothorax. No acute osseous findings. Artifact from EKG leads. IMPRESSION: Negative chest. Electronically Signed   By: Monte Fantasia M.D.   On: 05/18/2019 05:36    Procedures Procedures  Medications Ordered in ED Medications  sodium chloride 0.9 % bolus 1,000 mL (has no administration in time range)  sodium chloride flush (NS) 0.9 % injection 3 mL (3 mLs Intravenous Given 05/18/19 0504)     Initial Impression / Assessment and Plan / ED Course  I have reviewed the triage vital signs and the nursing notes.  Pertinent labs & imaging results that were available during my care of the patient were reviewed by me and considered in my medical decision making (see chart for details).     HEAR Score: 2  6:55 AM PT Presents with episode of chest pain  shortness of breath that woke her up.  She reports ongoing episodes of vertigo, she had recent negative CT head.  No focal neuro deficits noted. She has mildly acute on chronic anemia 7:13 AM Pt stable and appears improved Plan at signout to dr Rober Minion troponin Give IV fluids If pt improved/can ambulate she can be discharged If any ataxia or worsened vertigo she may need MRI Final Clinical Impressions(s) / ED Diagnoses   Final diagnoses:  None    ED Discharge Orders    None       Ripley Fraise, MD 05/18/19 [phone]

## 2019-05-18 NOTE — ED Provider Notes (Signed)
Patient care assumed at 0700.  Pt here for evaluation of chest pain and vertigo, initial troponin negative.  Repeat troponin pending.    Repeat troponin is negative. Patient's dizziness has resolved. Plan to discharge home with outpatient follow-up and return precautions.  Presentation not c/w ACS, PE, dissection, CVA.   Quintella Reichert, MD 05/18/19 [phone]

## 2019-05-19 ENCOUNTER — Encounter: Payer: Self-pay | Admitting: Physician Assistant

## 2019-05-19 LAB — TSH: TSH: 1.73 u[IU]/mL (ref 0.35–4.50)

## 2019-05-20 ENCOUNTER — Other Ambulatory Visit: Payer: Self-pay

## 2019-05-20 DIAGNOSIS — R42 Dizziness and giddiness: Secondary | ICD-10-CM

## 2019-05-28 ENCOUNTER — Encounter: Payer: Self-pay | Admitting: Physician Assistant

## 2019-06-02 ENCOUNTER — Other Ambulatory Visit: Payer: Self-pay

## 2019-06-02 ENCOUNTER — Encounter: Payer: Self-pay | Admitting: Family Medicine

## 2019-06-02 ENCOUNTER — Ambulatory Visit (INDEPENDENT_AMBULATORY_CARE_PROVIDER_SITE_OTHER): Payer: BC Managed Care – PPO

## 2019-06-02 DIAGNOSIS — Z23 Encounter for immunization: Secondary | ICD-10-CM

## 2019-06-02 DIAGNOSIS — J343 Hypertrophy of nasal turbinates: Secondary | ICD-10-CM | POA: Diagnosis not present

## 2019-06-02 DIAGNOSIS — D509 Iron deficiency anemia, unspecified: Secondary | ICD-10-CM

## 2019-06-02 DIAGNOSIS — J351 Hypertrophy of tonsils: Secondary | ICD-10-CM | POA: Diagnosis not present

## 2019-06-02 DIAGNOSIS — R42 Dizziness and giddiness: Secondary | ICD-10-CM | POA: Diagnosis not present

## 2019-06-03 LAB — CBC WITH DIFFERENTIAL/PLATELET
Basophils Absolute: 0.1 10*3/uL (ref 0.0–0.1)
Basophils Relative: 1.2 % (ref 0.0–3.0)
Eosinophils Absolute: 0.2 10*3/uL (ref 0.0–0.7)
Eosinophils Relative: 2.3 % (ref 0.0–5.0)
HCT: 30.5 % — ABNORMAL LOW (ref 36.0–46.0)
Hemoglobin: 9.3 g/dL — ABNORMAL LOW (ref 12.0–15.0)
Lymphocytes Relative: 26 % (ref 12.0–46.0)
Lymphs Abs: 1.9 10*3/uL (ref 0.7–4.0)
MCHC: 30.3 g/dL (ref 30.0–36.0)
MCV: 67.3 fl — ABNORMAL LOW (ref 78.0–100.0)
Monocytes Absolute: 0.3 10*3/uL (ref 0.1–1.0)
Monocytes Relative: 4.7 % (ref 3.0–12.0)
Neutro Abs: 4.9 10*3/uL (ref 1.4–7.7)
Neutrophils Relative %: 65.8 % (ref 43.0–77.0)
Platelets: 529 10*3/uL — ABNORMAL HIGH (ref 150.0–400.0)
RBC: 4.53 Mil/uL (ref 3.87–5.11)
RDW: 18.9 % — ABNORMAL HIGH (ref 11.5–15.5)
WBC: 7.4 10*3/uL (ref 4.0–10.5)

## 2019-06-03 LAB — IRON: Iron: 23 ug/dL — ABNORMAL LOW (ref 42–145)

## 2019-06-04 ENCOUNTER — Other Ambulatory Visit: Payer: Self-pay

## 2019-06-04 DIAGNOSIS — E611 Iron deficiency: Secondary | ICD-10-CM

## 2019-06-04 DIAGNOSIS — D508 Other iron deficiency anemias: Secondary | ICD-10-CM

## 2019-06-09 ENCOUNTER — Telehealth: Payer: Self-pay | Admitting: Physician Assistant

## 2019-06-09 NOTE — Telephone Encounter (Signed)
Received a new hem referral from Raiford Noble, [place] at [facility] at [facility] for [place]. Ms. Tamminga has been cld and scheduled to see Cassie on 10/5 at 1:30pm. She's been made aware to arrive 15 minutes early.

## 2019-06-12 ENCOUNTER — Other Ambulatory Visit: Payer: Self-pay | Admitting: General Practice

## 2019-06-12 DIAGNOSIS — R42 Dizziness and giddiness: Secondary | ICD-10-CM | POA: Diagnosis not present

## 2019-06-12 MED ORDER — METFORMIN HCL 1000 MG PO TABS: ORAL_TABLET | ORAL | 0 refills | Status: DC

## 2019-06-29 ENCOUNTER — Inpatient Hospital Stay: Payer: BC Managed Care – PPO | Attending: Physician Assistant | Admitting: Physician Assistant

## 2019-06-29 ENCOUNTER — Telehealth: Payer: Self-pay | Admitting: Physician Assistant

## 2019-06-29 NOTE — Telephone Encounter (Signed)
Called the patient about her appointment today that was scheduled for 1:30. Left a voicemail and asked that she call us back to rescheduled at [phone].

## 2019-07-06 DIAGNOSIS — E119 Type 2 diabetes mellitus without complications: Secondary | ICD-10-CM | POA: Diagnosis not present

## 2019-07-06 LAB — HM DIABETES EYE EXAM

## 2019-07-21 ENCOUNTER — Encounter: Payer: Self-pay | Admitting: General Practice

## 2019-09-08 ENCOUNTER — Other Ambulatory Visit: Payer: Self-pay | Admitting: General Practice

## 2019-09-08 MED ORDER — METFORMIN HCL 1000 MG PO TABS: ORAL_TABLET | ORAL | 0 refills | Status: DC

## 2019-09-22 ENCOUNTER — Encounter: Payer: Self-pay | Admitting: Family Medicine

## 2019-09-30 DIAGNOSIS — Z20828 Contact with and (suspected) exposure to other viral communicable diseases: Secondary | ICD-10-CM | POA: Diagnosis not present

## 2019-10-12 ENCOUNTER — Encounter: Payer: Self-pay | Admitting: Family Medicine

## 2019-11-28 ENCOUNTER — Ambulatory Visit: Payer: BC Managed Care – PPO | Attending: Internal Medicine

## 2019-11-28 DIAGNOSIS — Z23 Encounter for immunization: Secondary | ICD-10-CM

## 2019-11-28 NOTE — Progress Notes (Signed)
   Covid-19 Vaccination Clinic  Name:  Sarah Faulkner    MRN: [id number] DOB: [birth year]  11/28/2019  [name] was observed post Covid-19 immunization for 15 minutes without incident. She was provided with Vaccine Information Sheet and instruction to access the V-Safe system.   [name] was instructed to call 911 with any severe reactions post vaccine: Marland Kitchen Difficulty breathing  . Swelling of face and throat  . A fast heartbeat  . A bad rash all over body  . Dizziness and weakness   Immunizations Administered    Name Date Dose VIS Date Route   Pfizer COVID-19 Vaccine 11/28/2019  4:20 PM 0.3 mL 09/04/2019 Intramuscular   Manufacturer: [place]   Lot: [id number]   [place]: [id number]

## 2019-12-09 ENCOUNTER — Other Ambulatory Visit: Payer: Self-pay

## 2019-12-10 ENCOUNTER — Other Ambulatory Visit: Payer: Self-pay

## 2019-12-10 ENCOUNTER — Ambulatory Visit (INDEPENDENT_AMBULATORY_CARE_PROVIDER_SITE_OTHER): Payer: BC Managed Care – PPO | Admitting: Family Medicine

## 2019-12-10 ENCOUNTER — Encounter: Payer: Self-pay | Admitting: Family Medicine

## 2019-12-10 VITALS — BP 133/84 | HR 89 | Temp 98.5°F | Resp 16 | Ht 64.0 in | Wt 348.5 lb

## 2019-12-10 DIAGNOSIS — E119 Type 2 diabetes mellitus without complications: Secondary | ICD-10-CM | POA: Diagnosis not present

## 2019-12-10 DIAGNOSIS — D509 Iron deficiency anemia, unspecified: Secondary | ICD-10-CM

## 2019-12-10 DIAGNOSIS — E559 Vitamin D deficiency, unspecified: Secondary | ICD-10-CM

## 2019-12-10 DIAGNOSIS — Z0000 Encounter for general adult medical examination without abnormal findings: Secondary | ICD-10-CM

## 2019-12-10 LAB — LIPID PANEL
Cholesterol: 103 mg/dL (ref 0–200)
HDL: 47.4 mg/dL (ref >39.00)
LDL Cholesterol: 49 mg/dL (ref 0–99)
NonHDL: 55.98
Total CHOL/HDL Ratio: 2
Triglycerides: 37 mg/dL (ref 0.0–149.0)
VLDL: 7.4 mg/dL (ref 0.0–40.0)

## 2019-12-10 LAB — CBC WITH DIFFERENTIAL/PLATELET
Basophils Absolute: 0 10*3/uL (ref 0.0–0.1)
Basophils Relative: 0.6 % (ref 0.0–3.0)
Eosinophils Absolute: 0.1 10*3/uL (ref 0.0–0.7)
Eosinophils Relative: 1.9 % (ref 0.0–5.0)
HCT: 28.3 % — ABNORMAL LOW (ref 36.0–46.0)
Hemoglobin: 8.4 g/dL — ABNORMAL LOW (ref 12.0–15.0)
Lymphocytes Relative: 25.2 % (ref 12.0–46.0)
Lymphs Abs: 1.7 10*3/uL (ref 0.7–4.0)
MCHC: 29.6 g/dL — ABNORMAL LOW (ref 30.0–36.0)
MCV: 61.7 fl — ABNORMAL LOW (ref 78.0–100.0)
Monocytes Absolute: 0.4 10*3/uL (ref 0.1–1.0)
Monocytes Relative: 5.6 % (ref 3.0–12.0)
Neutro Abs: 4.5 10*3/uL (ref 1.4–7.7)
Neutrophils Relative %: 66.7 % (ref 43.0–77.0)
Platelets: 741 10*3/uL — ABNORMAL HIGH (ref 150.0–400.0)
RBC: 4.58 Mil/uL (ref 3.87–5.11)
RDW: 17.5 % — ABNORMAL HIGH (ref 11.5–15.5)
WBC: 6.8 10*3/uL (ref 4.0–10.5)

## 2019-12-10 LAB — BASIC METABOLIC PANEL
BUN: 9 mg/dL (ref 6–23)
CO2: 29 mEq/L (ref 19–32)
Calcium: 9.4 mg/dL (ref 8.4–10.5)
Chloride: 101 mEq/L (ref 96–112)
Creatinine, Ser: 0.72 mg/dL (ref 0.40–1.20)
GFR: 109.37 mL/min (ref >60.00)
Glucose, Bld: 101 mg/dL — ABNORMAL HIGH (ref 70–99)
Potassium: 4.6 mEq/L (ref 3.5–5.1)
Sodium: 136 mEq/L (ref 135–145)

## 2019-12-10 LAB — VITAMIN D 25 HYDROXY (VIT D DEFICIENCY, FRACTURES): VITD: 67.17 ng/mL (ref 30.00–100.00)

## 2019-12-10 LAB — TSH: TSH: 2.45 u[IU]/mL (ref 0.35–4.50)

## 2019-12-10 LAB — HEPATIC FUNCTION PANEL
ALT: 12 U/L (ref 0–35)
AST: 10 U/L (ref 0–37)
Albumin: 3.8 g/dL (ref 3.5–5.2)
Alkaline Phosphatase: 55 U/L (ref 39–117)
Bilirubin, Direct: 0.1 mg/dL (ref 0.0–0.3)
Total Bilirubin: 0.3 mg/dL (ref 0.2–1.2)
Total Protein: 7.5 g/dL (ref 6.0–8.3)

## 2019-12-10 LAB — HEMOGLOBIN A1C: Hgb A1c MFr Bld: 6.4 % (ref 4.6–6.5)

## 2019-12-10 NOTE — Assessment & Plan Note (Signed)
Chronic problem.  Has been diet controlled but pt has gained 15 lbs since last visit.  Stressed need for healthy diet and regular exercise.  UTD on eye exam.  Foot exam done today.  Check labs.  Adjust tx prn

## 2019-12-10 NOTE — Progress Notes (Signed)
   Subjective:    Patient ID: Sarah Faulkner, female    DOB: Nov 27, 1980, 39 y.o.   MRN: [id number]  HPI CPE- due for pap ([facility]), UTD on flu and Tdap.  Pt has gained 15 lbs since last visit- working from home, very sedentary.  Has had 1st COVID vaccine  Review of Systems Patient reports no vision/ hearing changes, adenopathy,fever, persistant/recurrent hoarseness, swallowing issues, chest pain, palpitations, edema, persistant/recurrent cough, hemoptysis, dyspnea (rest/exertional/paroxysmal nocturnal), gastrointestinal bleeding (melena, rectal bleeding), abdominal pain, significant heartburn, bowel changes, GU symptoms (dysuria, hematuria, incontinence), Gyn symptoms (abnormal  bleeding, pain),  syncope, focal weakness, memory loss, numbness & tingling, skin/hair/nail changes, abnormal bruising or bleeding, anxiety, or depression.   This visit occurred during the SARS-CoV-2 public health emergency.  Safety protocols were in place, including screening questions prior to the visit, additional usage of staff PPE, and extensive cleaning of exam room while observing appropriate contact time as indicated for disinfecting solutions.       Objective:   Physical Exam General Appearance:    Alert, cooperative, no distress, appears stated age, obese  Head:    Normocephalic, without obvious abnormality, atraumatic  Eyes:    PERRL, conjunctiva/corneas clear, EOM's intact, fundi    benign, both eyes  Ears:    Normal TM's and external ear canals, both ears  Nose:   Deferred due to COVID  Throat:   Neck:   Supple, symmetrical, trachea midline, no adenopathy;    Thyroid: no enlargement/tenderness/nodules  Back:     Symmetric, no curvature, ROM normal, no CVA tenderness  Lungs:     Clear to auscultation bilaterally, respirations unlabored  Chest Wall:    No tenderness or deformity   Heart:    Regular rate and rhythm, S1 and S2 normal, no murmur, rub   or gallop  Breast Exam:     Deferred to GYN  Abdomen:     Soft, non-tender, bowel sounds active all four quadrants,    no masses, no organomegaly  Genitalia:    Deferred to GYN  Rectal:    Extremities:   Extremities normal, atraumatic, no cyanosis or edema  Pulses:   2+ and symmetric all extremities  Skin:   Skin color, texture, turgor normal, no rashes or lesions  Lymph nodes:   Cervical, supraclavicular, and axillary nodes normal  Neurologic:   CNII-XII intact, normal strength, sensation and reflexes    throughout          Assessment & Plan:

## 2019-12-10 NOTE — Patient Instructions (Signed)
Follow up in 3-4 months to recheck diabetes We'll notify you of your lab results and make any changes if needed Continue to work on healthy diet and regular exercise- you can do it! Call and schedule with GYN  [phone] Call with any questions or concerns Stay Safe!  Stay Healthy!

## 2019-12-10 NOTE — Assessment & Plan Note (Signed)
Pt has gained 15 lbs during [place].  Stressed need for healthy diet and regular exercise.  Will follow.

## 2019-12-10 NOTE — Assessment & Plan Note (Signed)
Pt's PE WNL w/ exception of obesity.  UTD on Tdap, flu, has had 1st COVID.  Due for pap- pt to call [facility] and schedule.  Check labs.  Anticipatory guidance provided.

## 2019-12-10 NOTE — Assessment & Plan Note (Signed)
Check labs and replete prn. 

## 2019-12-18 ENCOUNTER — Encounter: Payer: Self-pay | Admitting: Family Medicine

## 2019-12-18 DIAGNOSIS — Z113 Encounter for screening for infections with a predominantly sexual mode of transmission: Secondary | ICD-10-CM

## 2019-12-18 MED ORDER — VALACYCLOVIR HCL 1 G PO TABS: 1000.0000 mg | ORAL_TABLET | Freq: Every day | ORAL | 3 refills | Status: DC

## 2019-12-19 ENCOUNTER — Ambulatory Visit: Payer: BC Managed Care – PPO | Attending: Internal Medicine

## 2019-12-19 DIAGNOSIS — Z23 Encounter for immunization: Secondary | ICD-10-CM

## 2019-12-19 NOTE — Progress Notes (Signed)
   Covid-19 Vaccination Clinic  Name:  Sarah Faulkner    MRN: [id number] DOB: 1980-11-26  12/19/2019  [name] was observed post Covid-19 immunization for 15 minutes without incident. She was provided with Vaccine Information Sheet and instruction to access the V-Safe system.   [name] was instructed to call 911 with any severe reactions post vaccine: Marland Kitchen Difficulty breathing  . Swelling of face and throat  . A fast heartbeat  . A bad rash all over body  . Dizziness and weakness   Immunizations Administered    Name Date Dose VIS Date Route   Pfizer COVID-19 Vaccine 12/19/2019  3:14 PM 0.3 mL 09/04/2019 Intramuscular   Manufacturer: [place]   Lot: [id number]   [place]: [id number]

## 2019-12-21 NOTE — Addendum Note (Signed)
Addended by: Brunetta Jeans on: 12/21/2019 01:53 PM   Modules accepted: Orders

## 2019-12-24 ENCOUNTER — Other Ambulatory Visit: Payer: BC Managed Care – PPO

## 2019-12-24 DIAGNOSIS — Z113 Encounter for screening for infections with a predominantly sexual mode of transmission: Secondary | ICD-10-CM | POA: Diagnosis not present

## 2019-12-26 LAB — HSV(HERPES SMPLX)ABS-I+II(IGG+IGM)-BLD
HSV 1 Glycoprotein G Ab, IgG: 0.97 index — ABNORMAL HIGH (ref 0.00–0.90)
HSV 2 IgG, Type Spec: 11 index — ABNORMAL HIGH (ref 0.00–0.90)
HSVI/II Comb IgM: <0.91 Ratio (ref 0.00–0.90)

## 2019-12-28 ENCOUNTER — Other Ambulatory Visit: Payer: Self-pay | Admitting: General Practice

## 2019-12-28 MED ORDER — METFORMIN HCL 1000 MG PO TABS: ORAL_TABLET | ORAL | 0 refills | Status: DC

## 2020-03-14 ENCOUNTER — Ambulatory Visit: Payer: BC Managed Care – PPO | Admitting: Family Medicine

## 2020-04-11 ENCOUNTER — Encounter: Payer: Self-pay | Admitting: Family Medicine

## 2020-04-11 ENCOUNTER — Other Ambulatory Visit (HOSPITAL_COMMUNITY)
Admission: RE | Admit: 2020-04-11 | Discharge: 2020-04-11 | Disposition: A | Discharge status: Home / Self Care | Payer: BC Managed Care – PPO | Source: Ambulatory Visit | Attending: Family Medicine | Admitting: Family Medicine

## 2020-04-11 ENCOUNTER — Ambulatory Visit: Payer: BC Managed Care – PPO | Admitting: Family Medicine

## 2020-04-11 ENCOUNTER — Other Ambulatory Visit: Payer: Self-pay

## 2020-04-11 VITALS — BP 130/80 | HR 86 | Temp 98.4°F | Resp 16 | Ht 64.0 in | Wt 365.2 lb

## 2020-04-11 DIAGNOSIS — Z113 Encounter for screening for infections with a predominantly sexual mode of transmission: Secondary | ICD-10-CM | POA: Insufficient documentation

## 2020-04-11 DIAGNOSIS — E119 Type 2 diabetes mellitus without complications: Secondary | ICD-10-CM | POA: Diagnosis not present

## 2020-04-11 NOTE — Patient Instructions (Addendum)
Follow up in 3-4 months to recheck sugar, weight loss progress, and cholesterol We'll notify you of your lab results and make any changes if needed Continue to work on healthy diet and regular exercise- you can do it! Your eye exam is due in October Call and schedule the pap at your convenience [phone] Call with any questions or concerns Have a great summer!!!

## 2020-04-11 NOTE — Assessment & Plan Note (Signed)
Chronic problem.  Has been able to control w/ diet and exercise but admits to doing poorly on both recently.  UTD on eye exam, foot exam.  Will get microalbumin today.  Check labs and adjust tx plan prn.

## 2020-04-11 NOTE — Assessment & Plan Note (Signed)
Deteriorated.  Pt has gained 15 lbs since last visit.  Stressed need for healthy diet and regular exercise.  Will follow.

## 2020-04-11 NOTE — Progress Notes (Signed)
   Subjective:    Patient ID: Sarah Faulkner, female    DOB: 1980/12/01, 39 y.o.   MRN: [id number]  HPI DM- chronic problem, on Metformin 1000mg  BID.  UTD on foot exam, eye exam.  Due for microalbumin.  Pt has gained 15 lbs since last visit.  Denies symptomatic lows.  No CP, SOB, HAs, visual changes, abd pain, N/V.  No numbness/tingling of hands/feet.  No regular exercise.  Not following low carb diet.  Obesity- pt has gained 15 lbs since last visit.  BMI now 62.7  No regular exercise.  + stress eating.  STD screening- pt has no specific concerns but is in a new relationship.  No discharge, pain, abnormal bleeding.   Review of Systems For ROS see HPI   This visit occurred during the SARS-CoV-2 public health emergency.  Safety protocols were in place, including screening questions prior to the visit, additional usage of staff PPE, and extensive cleaning of exam room while observing appropriate contact time as indicated for disinfecting solutions.       Objective:   Physical Exam Vitals reviewed.  Constitutional:      General: She is not in acute distress.    Appearance: She is well-developed. She is obese.  HENT:     Head: Normocephalic and atraumatic.  Eyes:     Conjunctiva/sclera: Conjunctivae normal.     Pupils: Pupils are equal, round, and reactive to light.  Neck:     Thyroid: No thyromegaly.  Cardiovascular:     Rate and Rhythm: Normal rate and regular rhythm.     Heart sounds: Normal heart sounds. No murmur heard.   Pulmonary:     Effort: Pulmonary effort is normal. No respiratory distress.     Breath sounds: Normal breath sounds.  Abdominal:     General: There is no distension.     Palpations: Abdomen is soft.     Tenderness: There is no abdominal tenderness.  Musculoskeletal:     Cervical back: Normal range of motion and neck supple.  Lymphadenopathy:     Cervical: No cervical adenopathy.  Skin:    General: Skin is warm and dry.  Neurological:     Mental  Status: She is alert and oriented to person, place, and time.  Psychiatric:        Behavior: Behavior normal.           Assessment & Plan:  STD screen- pt has no specific concerns but is considering starting a sexual relationship and wants to be sure going into it.  Applauded her for being responsible.

## 2020-04-12 LAB — BASIC METABOLIC PANEL
BUN: 9 mg/dL (ref 6–23)
CO2: 25 mEq/L (ref 19–32)
Calcium: 8.8 mg/dL (ref 8.4–10.5)
Chloride: 101 mEq/L (ref 96–112)
Creatinine, Ser: 0.74 mg/dL (ref 0.40–1.20)
GFR: 105.77 mL/min (ref >60.00)
Glucose, Bld: 151 mg/dL — ABNORMAL HIGH (ref 70–99)
Potassium: 4.3 mEq/L (ref 3.5–5.1)
Sodium: 134 mEq/L — ABNORMAL LOW (ref 135–145)

## 2020-04-12 LAB — HIV ANTIBODY (ROUTINE TESTING W REFLEX): HIV 1&2 Ab, 4th Generation: NONREACTIVE

## 2020-04-12 LAB — HEMOGLOBIN A1C: Hgb A1c MFr Bld: 6.4 % (ref 4.6–6.5)

## 2020-04-12 LAB — RPR: RPR Ser Ql: NONREACTIVE

## 2020-04-13 ENCOUNTER — Ambulatory Visit: Payer: BC Managed Care – PPO

## 2020-04-13 LAB — URINE CYTOLOGY ANCILLARY ONLY
Chlamydia: NEGATIVE
Comment: NEGATIVE
Comment: NORMAL
Neisseria Gonorrhea: NEGATIVE

## 2020-04-14 ENCOUNTER — Ambulatory Visit: Payer: BC Managed Care – PPO

## 2020-04-14 ENCOUNTER — Other Ambulatory Visit: Payer: Self-pay

## 2020-04-14 LAB — MICROALBUMIN / CREATININE URINE RATIO
Creatinine,U: 153.3 mg/dL
Microalb Creat Ratio: 0.5 mg/g (ref 0.0–30.0)
Microalb, Ur: <0.7 mg/dL (ref 0.0–1.9)

## 2020-04-15 ENCOUNTER — Other Ambulatory Visit: Payer: Self-pay | Admitting: Family Medicine

## 2020-04-15 MED ORDER — METFORMIN HCL 1000 MG PO TABS: ORAL_TABLET | ORAL | 0 refills | Status: DC

## 2020-04-18 ENCOUNTER — Other Ambulatory Visit: Payer: Self-pay | Admitting: Family Medicine

## 2020-07-16 ENCOUNTER — Other Ambulatory Visit: Payer: Self-pay | Admitting: Family Medicine

## 2020-07-29 ENCOUNTER — Ambulatory Visit: Payer: BC Managed Care – PPO | Admitting: Family Medicine

## 2020-08-10 DIAGNOSIS — Z20822 Contact with and (suspected) exposure to covid-19: Secondary | ICD-10-CM | POA: Diagnosis not present

## 2020-08-17 ENCOUNTER — Ambulatory Visit: Payer: BC Managed Care – PPO | Admitting: Family Medicine

## 2020-09-02 ENCOUNTER — Other Ambulatory Visit: Payer: Self-pay

## 2020-09-02 NOTE — Telephone Encounter (Signed)
LFD 04/18/20 #30 with 3 refills LOV 04/11/20 NOV 09/08/20

## 2020-09-05 MED ORDER — VALACYCLOVIR HCL 1 G PO TABS: ORAL_TABLET | ORAL | 3 refills | Status: DC

## 2020-09-08 ENCOUNTER — Ambulatory Visit: Payer: BC Managed Care – PPO | Admitting: Family Medicine

## 2020-10-12 ENCOUNTER — Telehealth (INDEPENDENT_AMBULATORY_CARE_PROVIDER_SITE_OTHER): Payer: BC Managed Care – PPO | Admitting: Family Medicine

## 2020-10-12 ENCOUNTER — Encounter: Payer: Self-pay | Admitting: Family Medicine

## 2020-10-12 DIAGNOSIS — E119 Type 2 diabetes mellitus without complications: Secondary | ICD-10-CM | POA: Diagnosis not present

## 2020-10-12 NOTE — Progress Notes (Signed)
   Virtual Visit via Video   I connected with patient on 10/12/20 at  2:00 PM EST by a video enabled telemedicine application and verified that I am speaking with the correct person using two identifiers.  Location patient: Home Location provider: [facility], Office Persons participating in the virtual visit: Patient, Provider, [place] (Sabrina M)  I discussed the limitations of evaluation and management by telemedicine and the availability of in person appointments. The patient expressed understanding and agreed to proceed.  Subjective:   HPI:   Diabetes- ongoing issue for pt.  On Metformin 1000mg  BID.  Due for eye exam.  UTD on foot exam.  UTD on microalbumin.  No regular exercise.  Denies CP, occasional SOB due to deconditioning.  No HAs, visual changes, abd pain, N/V.  Denies symptomatic lows.  Denies numbness/tingling of hands/feet.  Obesity- pt is working from home which has caused weight gain.  She notes feeling worse since she stopped her regular exercise routine.  Pt reports she is drinking water, avoiding late eating, trying to stick w/ low carb  ROS:   See pertinent positives and negatives per HPI.  Patient Active Problem List   Diagnosis Date Noted  . Anxiety and depression 01/23/2019  . Family history of breast cancer in mother 05/23/2017  . Vitamin D deficiency 05/23/2017  . Anemia, iron deficiency 07/21/2015  . Menorrhagia 07/21/2015  . Radicular low back pain 07/15/2015  . Physical exam 09/29/2013  . Diabetes mellitus type II, controlled, with no complications ([place]) 54/62/7035  . Morbid obesity ([place]) 12/16/2012  . Pain in right shoulder 12/16/2012    Social History   Tobacco Use  . Smoking status: Never Smoker  . Smokeless tobacco: Never Used  Substance Use Topics  . Alcohol use: No    Current Outpatient Medications:  .  Cholecalciferol 25 MCG (1000 UT) tablet, Take 2,000 Units by mouth daily., Disp: , Rfl:  .  ferrous sulfate 325 (65 FE) MG  tablet, Take 1 tablet (325 mg total) by mouth daily with breakfast., Disp: 30 tablet, Rfl: 5 .  metFORMIN (GLUCOPHAGE) 1000 MG tablet, TAKE 1 TABLET(1000 MG) BY MOUTH TWICE DAILY WITH A MEAL, Disp: 180 tablet, Rfl: 0 .  UNABLE TO FIND, Diabetic Supplies:  Glucometer, test strips, lancets. Check glucose BID (fasting and 2 hours after eating). One month supply. Refills PRN., Disp: , Rfl:  .  valACYclovir (VALTREX) 1000 MG tablet, TAKE 1 TABLET(1000 MG) BY MOUTH DAILY, Disp: 30 tablet, Rfl: 3  No Known Allergies  Objective:   There were no vitals taken for this visit.  AAOx3, NAD NCAT, EOMI obese No obvious CN deficits Coloring WNL Pt is able to speak clearly, coherently without shortness of breath or increased work of breathing.  Thought process is linear.  Mood is appropriate.   Assessment and Plan:   DM- chronic problem.  On Metformin 1000mg  BID.  UTD on foot exam, microalbumin.  Due for eye exam- pt to schedule.  Encouraged healthy diet and regular exercise.  Check labs.  Adjust meds prn   Obesity- ongoing issue for pt.  She has been working from home for nearly 2 yrs due to the pandemic and this has caused her to lose her exercise routine.  Due to this, she has gained weight.  Discussed need for regular activity, routine, and low carb diet.  Will follow.  Annye Asa, MD 10/12/2020

## 2020-10-12 NOTE — Progress Notes (Signed)
I connected with  Sarah Faulkner on 10/12/20 by a video enabled telemedicine application and verified that I am speaking with the correct person using two identifiers.   I discussed the limitations of evaluation and management by telemedicine. The patient expressed understanding and agreed to proceed.

## 2020-10-22 ENCOUNTER — Encounter: Payer: Self-pay | Admitting: Family Medicine

## 2020-10-26 ENCOUNTER — Other Ambulatory Visit (HOSPITAL_COMMUNITY)
Admission: RE | Admit: 2020-10-26 | Discharge: 2020-10-26 | Disposition: A | Discharge status: Home / Self Care | Payer: BC Managed Care – PPO | Source: Ambulatory Visit | Attending: Obstetrics and Gynecology | Admitting: Obstetrics and Gynecology

## 2020-10-26 ENCOUNTER — Other Ambulatory Visit: Payer: Self-pay

## 2020-10-26 ENCOUNTER — Ambulatory Visit (INDEPENDENT_AMBULATORY_CARE_PROVIDER_SITE_OTHER): Payer: BC Managed Care – PPO | Admitting: Obstetrics and Gynecology

## 2020-10-26 ENCOUNTER — Encounter: Payer: Self-pay | Admitting: Obstetrics and Gynecology

## 2020-10-26 VITALS — BP 140/86 | HR 84 | Temp 97.9°F | Ht 64.0 in | Wt 370.6 lb

## 2020-10-26 DIAGNOSIS — N939 Abnormal uterine and vaginal bleeding, unspecified: Secondary | ICD-10-CM

## 2020-10-26 DIAGNOSIS — Z113 Encounter for screening for infections with a predominantly sexual mode of transmission: Secondary | ICD-10-CM | POA: Diagnosis not present

## 2020-10-26 DIAGNOSIS — Z01419 Encounter for gynecological examination (general) (routine) without abnormal findings: Secondary | ICD-10-CM | POA: Diagnosis not present

## 2020-10-26 DIAGNOSIS — Z124 Encounter for screening for malignant neoplasm of cervix: Secondary | ICD-10-CM | POA: Diagnosis not present

## 2020-10-26 LAB — POCT URINE PREGNANCY: Preg Test, Ur: NEGATIVE

## 2020-10-26 MED ORDER — MEGESTROL ACETATE 40 MG PO TABS: ORAL_TABLET | ORAL | 3 refills | Status: DC

## 2020-10-26 NOTE — Progress Notes (Signed)
WELL-WOMAN PHYSICAL & PAP Patient name: Sarah Faulkner MRN [id number]  Date of birth: 1981/03/12 Chief Complaint:   Menstrual Problem (New GYN) and Gynecologic Exam  History of Present Illness:   Sarah Faulkner is a 40 y.o. [id number] African American female being seen today for a routine well-woman exam.  Current complaints: irregular bleeding  PCP: Annye Asa, MD      does desire labs No LMP recorded. (Menstrual status: Irregular Periods). The current method of family planning is none.  Last pap 09/29/2013. Results were: normal Last mammogram: 2019. Results were: normal. Family h/o breast cancer: Yes - patient's mother was dx'd with BrCa (cancer free now) Last colonoscopy: n/a. Results were: n/a. Family h/o colorectal cancer: No Review of Systems:   Pertinent items are noted in HPI Denies any headaches, blurred vision, fatigue, shortness of breath, chest pain, abdominal pain, abnormal vaginal discharge/itching/odor/irritation, problems with periods, bowel movements, urination, or intercourse unless otherwise stated above. Pertinent History Reviewed:  Reviewed past medical,surgical, social and family history.  Reviewed problem list, medications and allergies. Physical Assessment:   Vitals:   10/26/20 1508  BP: 140/86  Pulse: 84  Temp: 97.9 F (36.6 C)  TempSrc: Oral  Weight: (!) 370 lb 9.6 oz (168.1 kg)  Height: _0  (1.626 m)  Body mass index is 63.61 kg/m.        Physical Examination:   General appearance - well appearing, and in no distress  Mental status - alert, oriented to person, place, and time  Psych:  She has a normal mood and affect  Skin - warm and dry, normal color, no suspicious lesions noted  Chest - effort normal, all lung fields clear to auscultation bilaterally  Heart - normal rate and regular rhythm  Neck:  midline trachea, no thyromegaly or nodules  Breasts - breasts appear normal, no suspicious masses, no skin or nipple changes  or  axillary nodes  Abdomen - soft, nontender, nondistended, no masses or organomegaly  Pelvic - VULVA: normal appearing vulva with no masses, tenderness or lesions  VAGINA: normal appearing vagina with normal color and discharge, no lesions  CERVIX: normal appearing cervix without discharge or lesions, no CMT  Thin prep pap is done with HR HPV cotesting  UTERUS: uterus is felt to be normal size, shape, consistency and nontender   ADNEXA: No adnexal masses or tenderness noted.  Rectal - deferred  Extremities:  No swelling or varicosities noted  Results for orders placed or performed in visit on 10/26/20 (from the past 24 hour(s))  POCT urine pregnancy   Collection Time: 10/26/20  4:02 PM  Result Value Ref Range   Preg Test, Ur Negative Negative    Assessment & Plan:  1) Well woman exam with routine gynecological exam  - Cytology - PAP( [place]),  - Cervicovaginal ancillary only( [place]),  - RPR+HBsAg+HCVAb+..., CBC, POCT urine pregnancy, TSH+T4F+T3Free  2) Screen for STD (sexually transmitted disease)  - Cervicovaginal ancillary only( [place]),  - RPR+HBsAg+HCVAb+...  3) Abnormal uterine bleeding (AUB)  - CBC,  - POCT urine pregnancy,  - TSH+T4F+T3Free,  - US PELVIC COMPLETE WITH TRANSVAGINAL  Labs/procedures today: Pap, STI testing, TSH, T3, T4, CBC, UPT  Mammogram 2022 or sooner if problems Colonoscopy at age 89 or sooner if problems  Orders Placed This Encounter  Procedures  . US PELVIC COMPLETE WITH TRANSVAGINAL  . RPR+HBsAg+HCVAb+...  . CBC  . TSH+T4F+T3Free  . POCT urine pregnancy    Meds:  Meds ordered  this encounter  Medications  . megestrol (MEGACE) 40 MG tablet    Sig: Take two tablets three times daily for 3 days, then two tablets twice daily for 3 days, then one tablet twice daily    Dispense:  90 tablet    Refill:  3    Order Specific Question:   Supervising Provider    Answer:   Donnamae Jude [[id number]]    Follow-up: No follow-ups on  file.  Laury Deep MSN, CNM 10/26/2020 3:52 PM

## 2020-10-26 NOTE — Patient Instructions (Signed)
Abnormal Uterine Bleeding  Abnormal uterine bleeding is unusual bleeding from the uterus. It includes bleeding after sex, or bleeding or spotting between menstrual periods. It may also include bleeding that is heavier than normal, menstrual periods that last longer than usual, or bleeding that occurs after menopause. Abnormal uterine bleeding can affect teenagers, women in their reproductive years, pregnant women, and women who have reached menopause. Common causes of abnormal uterine bleeding include:  Pregnancy.  Growths of tissue (polyps).  Benign tumors or growths in the uterus (fibroids). These are not cancer.  Infection.  Cancer.  Too much or too little of some hormones in the body (hormonal imbalances). Any type of abnormal bleeding should be checked by a health care provider. Many cases are minor and simple to treat, but others may be more serious. Treatment will depend on the cause and severity of the bleeding. Follow these instructions at home: Medicines  Take over-the-counter and prescription medicines only as told by your health care provider.  Tell your health care provider about other medicines that you take. You may be asked to stop taking aspirin or medicines that contain aspirin. These medicines can make bleeding worse.  If you were prescribed iron pills, take them as told by your health care provider. Iron pills help to replace iron that your body loses because of this condition. Managing constipation In cases of severe bleeding, you may be asked to increase your iron intake to treat anemia. This may cause constipation. To prevent or treat constipation, you may need to:  Drink enough fluid to keep your urine pale yellow.  Take over-the-counter or prescription medicines.  Eat foods that are high in fiber, such as beans, whole grains, and fresh fruits and vegetables.  Limit foods that are high in fat and processed sugars, such as fried or sweet foods. General  instructions  Monitor your condition for any changes.  Do not use tampons, douche, or have sex until your health care provider says these things are okay.  Change your pads often.  Get regular exams. This includes pelvic exams and cervical cancer screenings. ? It is up to you to get the results of any tests that are done. Ask your health care provider, or the department that is doing the tests, when your results will be ready.  Keep all follow-up visits as told by your health care provider. This is important. Contact a health care provider if you:  Have bleeding that lasts for more than 1 week.  Feel dizzy at times.  Feel nauseous or you vomit.  Feel light-headed or weak.  Notice any other changes that show that your condition is getting worse. Get help right away if you:  Pass out.  Have bleeding that soaks through a pad every hour.  Have pain in the abdomen.  Have a fever or chills.  Become sweaty or weak.  Pass large blood clots from your vagina. Summary  Abnormal uterine bleeding is unusual bleeding from the uterus.  Any type of abnormal bleeding should be evaluated by a health care provider. Many cases are minor and simple to treat, but others may be more serious.  Treatment will depend on the cause of the bleeding.  Get help right away if you pass out, you have bleeding that soaks through a pad every hour, or you pass large blood clots from your vagina. This information is not intended to replace advice given to you by your health care provider. Make sure you discuss any questions you  have with your health care provider. Document Revised: 05/18/2020 Document Reviewed: 07/14/2019 Elsevier Patient Education  [place].

## 2020-10-27 ENCOUNTER — Other Ambulatory Visit: Payer: Self-pay | Admitting: Obstetrics and Gynecology

## 2020-10-27 ENCOUNTER — Other Ambulatory Visit: Payer: Self-pay | Admitting: *Deleted

## 2020-10-27 ENCOUNTER — Encounter: Payer: Self-pay | Admitting: Family Medicine

## 2020-10-27 DIAGNOSIS — N939 Abnormal uterine and vaginal bleeding, unspecified: Secondary | ICD-10-CM

## 2020-10-27 DIAGNOSIS — D75839 Thrombocytosis, unspecified: Secondary | ICD-10-CM

## 2020-10-27 DIAGNOSIS — D509 Iron deficiency anemia, unspecified: Secondary | ICD-10-CM

## 2020-10-27 LAB — CBC
Hematocrit: 28.7 % — ABNORMAL LOW (ref 34.0–46.6)
Hemoglobin: 8 g/dL — ABNORMAL LOW (ref 11.1–15.9)
MCH: 16.1 pg — ABNORMAL LOW (ref 26.6–33.0)
MCHC: 27.9 g/dL — ABNORMAL LOW (ref 31.5–35.7)
MCV: 58 fL — ABNORMAL LOW (ref 79–97)
Platelets: 774 10*3/uL — ABNORMAL HIGH (ref 150–450)
RBC: 4.96 x10E6/uL (ref 3.77–5.28)
RDW: 19.5 % — ABNORMAL HIGH (ref 11.7–15.4)
WBC: 8.2 10*3/uL (ref 3.4–10.8)

## 2020-10-27 LAB — TSH+T4F+T3FREE
Free T4: 1.54 ng/dL (ref 0.82–1.77)
T3, Free: 3.1 pg/mL (ref 2.0–4.4)
TSH: 2.7 u[IU]/mL (ref 0.450–4.500)

## 2020-10-27 LAB — RPR+HBSAG+HCVAB+...
HIV Screen 4th Generation wRfx: NONREACTIVE
Hep C Virus Ab: <0.1 s/co ratio (ref 0.0–0.9)
Hepatitis B Surface Ag: NEGATIVE
RPR Ser Ql: NONREACTIVE

## 2020-10-27 MED ORDER — MEGESTROL ACETATE 40 MG PO TABS: ORAL_TABLET | ORAL | 3 refills | Status: DC

## 2020-10-27 NOTE — Progress Notes (Signed)
Needs Feraheme infusion x 2

## 2020-10-28 LAB — CERVICOVAGINAL ANCILLARY ONLY
Bacterial Vaginitis (gardnerella): NEGATIVE
Candida Glabrata: NEGATIVE
Candida Vaginitis: NEGATIVE
Chlamydia: NEGATIVE
Comment: NEGATIVE
Comment: NEGATIVE
Comment: NEGATIVE
Comment: NEGATIVE
Comment: NEGATIVE
Comment: NORMAL
Neisseria Gonorrhea: NEGATIVE
Trichomonas: NEGATIVE

## 2020-10-31 LAB — CYTOLOGY - PAP
Adequacy: ABSENT
Comment: NEGATIVE
Diagnosis: NEGATIVE
High risk HPV: NEGATIVE

## 2020-11-02 ENCOUNTER — Encounter (HOSPITAL_COMMUNITY)
Admission: RE | Admit: 2020-11-02 | Discharge: 2020-11-02 | Disposition: A | Discharge status: Home / Self Care | Payer: BC Managed Care – PPO | Source: Ambulatory Visit | Attending: Obstetrics and Gynecology | Admitting: Obstetrics and Gynecology

## 2020-11-02 ENCOUNTER — Other Ambulatory Visit: Payer: Self-pay

## 2020-11-02 DIAGNOSIS — D509 Iron deficiency anemia, unspecified: Secondary | ICD-10-CM | POA: Diagnosis not present

## 2020-11-02 MED ORDER — SODIUM CHLORIDE 0.9 % IV SOLN
510.0000 mg | INTRAVENOUS | Status: DC
  Administered 2020-11-02: 510 mg via INTRAVENOUS
  Filled 2020-11-02: qty 17

## 2020-11-02 NOTE — Discharge Instructions (Signed)
Ferumoxytol injection What is this medicine? FERUMOXYTOL is an iron complex. Iron is used to make healthy red blood cells, which carry oxygen and nutrients throughout the body. This medicine is used to treat iron deficiency anemia. This medicine may be used for other purposes; ask your health care provider or pharmacist if you have questions. COMMON BRAND NAME(S): Feraheme What should I tell my health care provider before I take this medicine? They need to know if you have any of these conditions:  anemia not caused by low iron levels  high levels of iron in the blood  magnetic resonance imaging (MRI) test scheduled  an unusual or allergic reaction to iron, other medicines, foods, dyes, or preservatives  pregnant or trying to get pregnant  breast-feeding How should I use this medicine? This medicine is for injection into a vein. It is given by a health care professional in a hospital or clinic setting. Talk to your pediatrician regarding the use of this medicine in children. Special care may be needed. Overdosage: If you think you have taken too much of this medicine contact a poison control center or emergency room at once. NOTE: This medicine is only for you. Do not share this medicine with others. What if I miss a dose? It is important not to miss your dose. Call your doctor or health care professional if you are unable to keep an appointment. What may interact with this medicine? This medicine may interact with the following medications:  other iron products This list may not describe all possible interactions. Give your health care provider a list of all the medicines, herbs, non-prescription drugs, or dietary supplements you use. Also tell them if you smoke, drink alcohol, or use illegal drugs. Some items may interact with your medicine. What should I watch for while using this medicine? Visit your doctor or healthcare professional regularly. Tell your doctor or healthcare  professional if your symptoms do not start to get better or if they get worse. You may need blood work done while you are taking this medicine. You may need to follow a special diet. Talk to your doctor. Foods that contain iron include: whole grains/cereals, dried fruits, beans, or peas, leafy green vegetables, and organ meats (liver, kidney). What side effects may I notice from receiving this medicine? Side effects that you should report to your doctor or health care professional as soon as possible:  allergic reactions like skin rash, itching or hives, swelling of the face, lips, or tongue  breathing problems  changes in blood pressure  feeling faint or lightheaded, falls  fever or chills  flushing, sweating, or hot feelings  swelling of the ankles or feet Side effects that usually do not require medical attention (report to your doctor or health care professional if they continue or are bothersome):  diarrhea  headache  nausea, vomiting  stomach pain This list may not describe all possible side effects. Call your doctor for medical advice about side effects. You may report side effects to FDA at 1-800-FDA-1088. Where should I keep my medicine? This drug is given in a hospital or clinic and will not be stored at home. NOTE: This sheet is a summary. It may not cover all possible information. If you have questions about this medicine, talk to your doctor, pharmacist, or health care provider.  2021 Elsevier/Gold Standard (2016-10-29 20:21:10)  

## 2020-11-04 ENCOUNTER — Other Ambulatory Visit: Payer: Self-pay | Admitting: Physician Assistant

## 2020-11-04 DIAGNOSIS — D509 Iron deficiency anemia, unspecified: Secondary | ICD-10-CM

## 2020-11-08 ENCOUNTER — Inpatient Hospital Stay: Payer: BC Managed Care – PPO

## 2020-11-08 ENCOUNTER — Encounter: Payer: Self-pay | Admitting: Family Medicine

## 2020-11-08 ENCOUNTER — Ambulatory Visit
Admission: RE | Admit: 2020-11-08 | Discharge: 2020-11-08 | Disposition: A | Discharge status: Home / Self Care | Payer: BC Managed Care – PPO | Source: Ambulatory Visit | Attending: Obstetrics and Gynecology | Admitting: Obstetrics and Gynecology

## 2020-11-08 ENCOUNTER — Inpatient Hospital Stay: Payer: BC Managed Care – PPO | Attending: Internal Medicine | Admitting: Internal Medicine

## 2020-11-08 ENCOUNTER — Other Ambulatory Visit: Payer: Self-pay

## 2020-11-08 ENCOUNTER — Other Ambulatory Visit: Payer: Self-pay | Admitting: Family Medicine

## 2020-11-08 ENCOUNTER — Telehealth: Payer: Self-pay | Admitting: Internal Medicine

## 2020-11-08 VITALS — BP 168/81 | HR 87 | Temp 98.9°F | Resp 15 | Ht 64.0 in | Wt 372.7 lb

## 2020-11-08 DIAGNOSIS — Z79899 Other long term (current) drug therapy: Secondary | ICD-10-CM | POA: Diagnosis not present

## 2020-11-08 DIAGNOSIS — N920 Excessive and frequent menstruation with regular cycle: Secondary | ICD-10-CM | POA: Insufficient documentation

## 2020-11-08 DIAGNOSIS — D500 Iron deficiency anemia secondary to blood loss (chronic): Secondary | ICD-10-CM | POA: Insufficient documentation

## 2020-11-08 DIAGNOSIS — D259 Leiomyoma of uterus, unspecified: Secondary | ICD-10-CM | POA: Diagnosis not present

## 2020-11-08 DIAGNOSIS — Z803 Family history of malignant neoplasm of breast: Secondary | ICD-10-CM | POA: Insufficient documentation

## 2020-11-08 DIAGNOSIS — N83291 Other ovarian cyst, right side: Secondary | ICD-10-CM | POA: Diagnosis not present

## 2020-11-08 DIAGNOSIS — N939 Abnormal uterine and vaginal bleeding, unspecified: Secondary | ICD-10-CM | POA: Diagnosis not present

## 2020-11-08 DIAGNOSIS — D509 Iron deficiency anemia, unspecified: Secondary | ICD-10-CM

## 2020-11-08 DIAGNOSIS — E119 Type 2 diabetes mellitus without complications: Secondary | ICD-10-CM | POA: Diagnosis not present

## 2020-11-08 LAB — CBC WITH DIFFERENTIAL (CANCER CENTER ONLY)
Abs Immature Granulocytes: 0.04 10*3/uL (ref 0.00–0.07)
Basophils Absolute: 0 10*3/uL (ref 0.0–0.1)
Basophils Relative: 0 %
Eosinophils Absolute: 0.1 10*3/uL (ref 0.0–0.5)
Eosinophils Relative: 2 %
HCT: 30.8 % — ABNORMAL LOW (ref 36.0–46.0)
Hemoglobin: 8.3 g/dL — ABNORMAL LOW (ref 12.0–15.0)
Immature Granulocytes: 1 %
Lymphocytes Relative: 22 %
Lymphs Abs: 1.6 10*3/uL (ref 0.7–4.0)
MCH: 16.8 pg — ABNORMAL LOW (ref 26.0–34.0)
MCHC: 26.9 g/dL — ABNORMAL LOW (ref 30.0–36.0)
MCV: 62.5 fL — ABNORMAL LOW (ref 80.0–100.0)
Monocytes Absolute: 0.5 10*3/uL (ref 0.1–1.0)
Monocytes Relative: 7 %
Neutro Abs: 5.1 10*3/uL (ref 1.7–7.7)
Neutrophils Relative %: 68 %
Platelet Count: 557 10*3/uL — ABNORMAL HIGH (ref 150–400)
RBC: 4.93 MIL/uL (ref 3.87–5.11)
RDW: 24.9 % — ABNORMAL HIGH (ref 11.5–15.5)
WBC Count: 7.3 10*3/uL (ref 4.0–10.5)
nRBC: 0.3 % — ABNORMAL HIGH (ref 0.0–0.2)

## 2020-11-08 LAB — CMP (CANCER CENTER ONLY)
ALT: 10 U/L (ref 0–44)
AST: 8 U/L — ABNORMAL LOW (ref 15–41)
Albumin: 3.4 g/dL — ABNORMAL LOW (ref 3.5–5.0)
Alkaline Phosphatase: 57 U/L (ref 38–126)
Anion gap: 7 (ref 5–15)
BUN: 8 mg/dL (ref 6–20)
CO2: 25 mmol/L (ref 22–32)
Calcium: 9.2 mg/dL (ref 8.9–10.3)
Chloride: 102 mmol/L (ref 98–111)
Creatinine: 0.79 mg/dL (ref 0.44–1.00)
GFR, Estimated: >60 mL/min (ref >60)
Glucose, Bld: 116 mg/dL — ABNORMAL HIGH (ref 70–99)
Potassium: 4.1 mmol/L (ref 3.5–5.1)
Sodium: 134 mmol/L — ABNORMAL LOW (ref 135–145)
Total Bilirubin: 0.4 mg/dL (ref 0.3–1.2)
Total Protein: 8.1 g/dL (ref 6.5–8.1)

## 2020-11-08 LAB — FERRITIN: Ferritin: 173 ng/mL (ref 11–307)

## 2020-11-08 LAB — IRON AND TIBC
Iron: 32 ug/dL — ABNORMAL LOW (ref 41–142)
Saturation Ratios: 9 % — ABNORMAL LOW (ref 21–57)
TIBC: 380 ug/dL (ref 236–444)
UIBC: 348 ug/dL (ref 120–384)

## 2020-11-08 LAB — VITAMIN B12: Vitamin B-12: 231 pg/mL (ref 180–914)

## 2020-11-08 LAB — FOLATE: Folate: 9.2 ng/mL (ref >5.9)

## 2020-11-08 NOTE — Progress Notes (Signed)
[facility] Telephone:([phone]) [phone]   Fax:([phone]) [phone]  CONSULT NOTE  REFERRING PHYSICIAN: Dr. Annye Asa  REASON FOR CONSULTATION:  40 years old African-American female with persistent anemia.  HPI Sarah Faulkner is a 40 y.o. female with past medical history significant for anemia, depression, and diabetes mellitus.  The patient was referred to me today for evaluation of persistent anemia.  She mentioned that she has anemia for many years.  Her anemia was secondary to heavy menstrual periods.  Her menstruation usually last around 7 to 10 days with 3-4 days of heavy bleeding with clots.  She has been on oral iron tablets in the past with no improvement in her anemia.  It was also associated with upset stomach and constipation.  She was seen recently by her gynecologist and the patient was started on Megestrol Acetate.  To decrease her menstrual bleeding.  She started this a month ago.  She was also seen by her primary care physician and received iron infusion the first dose was last week.  She is scheduled for another Feraheme infusion tomorrow and then on November 16, 2020.  The patient was referred to me today for evaluation and recommendation regarding her condition. When seen today she is feeling fine except for lightheadedness and fatigue most of the time.  She also has shortness of breath with exertion.  She has no craving for ice.  She also complain of intermittent abdominal cramps.  She has no chest pain, cough or hemoptysis.  She denied having any fever or chills.  She has no nausea, vomiting, diarrhea but has occasional constipation.  She has no other bleeding, bruises or ecchymosis. Family history significant for mother with breast cancer at age 59.  Father is healthy. The patient is single and has no children.  She works for [employer].  She denied having any history of smoking but drinks alcohol occasionally and no history of drug  abuse.  HPI  Past Medical History:  Diagnosis Date  . Anemia   . Chicken pox   . Depression   . Diabetes mellitus without complication [facility])     Past Surgical History:  Procedure Laterality Date  . CHOLECYSTECTOMY    . gallbladder removed  2005    Family History  Problem Relation Age of Onset  . Cancer Mother        breast  . Heart disease Maternal Grandmother   . Stroke Maternal Grandmother   . Hypertension Maternal Grandmother   . Breast cancer Neg Hx     Social History Social History   Tobacco Use  . Smoking status: Never Smoker  . Smokeless tobacco: Never Used  Vaping Use  . Vaping Use: Never used  Substance Use Topics  . Alcohol use: No  . Drug use: No    No Known Allergies  Current Outpatient Medications  Medication Sig Dispense Refill  . Cholecalciferol 25 MCG (1000 UT) tablet Take 2,000 Units by mouth daily.    . ferrous sulfate 325 (65 FE) MG tablet Take 1 tablet (325 mg total) by mouth daily with breakfast. 30 tablet 5  . megestrol (MEGACE) 40 MG tablet Take two tablets twice daily until seen by a physician 90 tablet 3  . metFORMIN (GLUCOPHAGE) 1000 MG tablet TAKE 1 TABLET(1000 MG) BY MOUTH TWICE DAILY WITH A MEAL 180 tablet 0  . UNABLE TO FIND Diabetic Supplies:  Glucometer, test strips, lancets. Check glucose BID (fasting and 2 hours after eating). One month  supply. Refills PRN.    . valACYclovir (VALTREX) 1000 MG tablet TAKE 1 TABLET(1000 MG) BY MOUTH DAILY 30 tablet 3   No current facility-administered medications for this visit.    Review of Systems  Constitutional: positive for fatigue Eyes: negative Ears, nose, mouth, throat, and face: negative Respiratory: positive for dyspnea on exertion Cardiovascular: negative Gastrointestinal: positive for abdominal pain and constipation Genitourinary:negative Integument/breast: negative Hematologic/lymphatic: negative Musculoskeletal:negative Neurological: negative Behavioral/Psych:  negative Endocrine: negative Allergic/Immunologic: negative  Physical Exam  OQH:UTMLY, healthy, no distress, well nourished and well developed SKIN: skin color, texture, turgor are normal, no rashes or significant lesions HEAD: Normocephalic, No masses, lesions, tenderness or abnormalities EYES: normal, PERRLA, Conjunctiva are pink and non-injected EARS: External ears normal, Canals clear OROPHARYNX:no exudate, no erythema and lips, buccal mucosa, and tongue normal  NECK: supple, no adenopathy, no JVD LYMPH:  no palpable lymphadenopathy, no hepatosplenomegaly BREAST:not examined LUNGS: clear to auscultation , and palpation HEART: regular rate & rhythm, no murmurs and no gallops ABDOMEN:abdomen soft, non-tender, obese, normal bowel sounds and no masses or organomegaly BACK: No CVA tenderness, Range of motion is normal EXTREMITIES:no joint deformities, effusion, or inflammation, no edema  NEURO: alert & oriented x 3 with fluent speech, no focal motor/sensory deficits  PERFORMANCE STATUS: ECOG 1  LABORATORY DATA: Lab Results  Component Value Date   WBC 7.3 11/08/2020   HGB 8.3 (L) 11/08/2020   HCT 30.8 (L) 11/08/2020   MCV 62.5 (L) 11/08/2020   PLT 557 (H) 11/08/2020      Chemistry      Component Value Date/Time   NA 134 (L) 04/11/2020 1540   K 4.3 04/11/2020 1540   CL 101 04/11/2020 1540   CO2 25 04/11/2020 1540   BUN 9 04/11/2020 1540   CREATININE 0.74 04/11/2020 1540   CREATININE 0.65 07/15/2015 1453      Component Value Date/Time   CALCIUM 8.8 04/11/2020 1540   ALKPHOS 55 12/10/2019 0847   AST 10 12/10/2019 0847   ALT 12 12/10/2019 0847   BILITOT 0.3 12/10/2019 0847       RADIOGRAPHIC STUDIES: No results found.  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   PLAN: I had a lengthy discussion with the patient today about her current condition  and treatment options. The patient was seen by her gynecologist and started on megestrol acetate to decrease her menstrual bleeding. She was on treatment with oral iron tablet but this was not effective in resolving her anemia and she had upset stomach and constipation with it. She started last week on iron infusion with Feraheme status post 1 dose and she is scheduled for tomorrow infusion tomorrow next week. I order several studies on the patient today including ferritin level which came back high but this is probably secondary to the iron infusion last week.  Her serum iron and iron saturation is still low.  Her serum folate and vitamin B12 level were normal I recommended for the patient to continue with the iron infusion tomorrow next week as planned. I will see her back for follow-up visit in around 6 weeks with repeat CBC, iron study and ferritin. She was advised to call immediately if she has any other concerning symptoms in the interval. The patient voices understanding of current disease status and treatment options and is in agreement with the current care plan.  All questions were answered. The patient knows to call  the clinic with any problems, questions or concerns. We can certainly see the patient much sooner if necessary.  Thank you so much for allowing me to participate in the care of Sarah Faulkner. I will continue to follow up the patient with you and assist in her care.  The total time spent in the appointment was 60 minutes.  Disclaimer: This note was dictated with voice recognition software. Similar sounding words can inadvertently be transcribed and may not be corrected upon review.   Eilleen Kempf November 08, 2020, 12:13 PM

## 2020-11-08 NOTE — Telephone Encounter (Signed)
Scheduled appointments per 2/15 los. Spoke to patient who is aware of appointments date and times.  

## 2020-11-09 ENCOUNTER — Encounter (HOSPITAL_COMMUNITY)
Admission: RE | Admit: 2020-11-09 | Discharge: 2020-11-09 | Disposition: A | Discharge status: Home / Self Care | Payer: BC Managed Care – PPO | Source: Ambulatory Visit | Attending: Obstetrics and Gynecology | Admitting: Obstetrics and Gynecology

## 2020-11-09 DIAGNOSIS — D509 Iron deficiency anemia, unspecified: Secondary | ICD-10-CM

## 2020-11-09 MED ORDER — SODIUM CHLORIDE 0.9 % IV SOLN
510.0000 mg | INTRAVENOUS | Status: DC
  Administered 2020-11-09: 510 mg via INTRAVENOUS
  Filled 2020-11-09: qty 17

## 2020-11-10 LAB — LIPID PANEL W/O CHOL/HDL RATIO
Cholesterol, Total: 97 mg/dL — ABNORMAL LOW (ref 100–199)
HDL: 46 mg/dL (ref >39)
LDL Chol Calc (NIH): 40 mg/dL (ref 0–99)
Triglycerides: 37 mg/dL (ref 0–149)
VLDL Cholesterol Cal: 11 mg/dL (ref 5–40)

## 2020-11-10 LAB — PROTEIN ELECTROPHORESIS, SERUM, WITH REFLEX
A/G Ratio: 0.7 (ref 0.7–1.7)
Albumin ELP: 3.2 g/dL (ref 2.9–4.4)
Alpha-1-Globulin: 0.2 g/dL (ref 0.0–0.4)
Alpha-2-Globulin: 0.9 g/dL (ref 0.4–1.0)
Beta Globulin: 1.3 g/dL (ref 0.7–1.3)
Gamma Globulin: 1.8 g/dL (ref 0.4–1.8)
Globulin, Total: 4.3 g/dL — ABNORMAL HIGH (ref 2.2–3.9)
Total Protein ELP: 7.5 g/dL (ref 6.0–8.5)

## 2020-11-10 LAB — HEPATIC FUNCTION PANEL
ALT: 12 IU/L (ref 0–32)
AST: 13 IU/L (ref 0–40)
Albumin: 3.8 g/dL (ref 3.8–4.8)
Alkaline Phosphatase: 64 IU/L (ref 44–121)
Bilirubin Total: <0.2 mg/dL (ref 0.0–1.2)
Bilirubin, Direct: <0.1 mg/dL (ref 0.00–0.40)
Total Protein: 7.7 g/dL (ref 6.0–8.5)

## 2020-11-10 LAB — HGB A1C W/O EAG: Hgb A1c MFr Bld: 6.2 % — ABNORMAL HIGH (ref 4.8–5.6)

## 2020-11-10 LAB — SPECIMEN STATUS REPORT

## 2020-11-10 LAB — TSH: TSH: 2.38 u[IU]/mL (ref 0.450–4.500)

## 2020-11-10 NOTE — Progress Notes (Signed)
Reviewed via mychart.

## 2020-11-16 ENCOUNTER — Encounter: Payer: Self-pay | Admitting: Obstetrics and Gynecology

## 2020-11-16 ENCOUNTER — Ambulatory Visit (HOSPITAL_COMMUNITY)
Admission: RE | Admit: 2020-11-16 | Discharge: 2020-11-16 | Disposition: A | Discharge status: Home / Self Care | Payer: BC Managed Care – PPO | Source: Ambulatory Visit | Attending: Obstetrics and Gynecology | Admitting: Obstetrics and Gynecology

## 2020-11-16 DIAGNOSIS — D509 Iron deficiency anemia, unspecified: Secondary | ICD-10-CM | POA: Insufficient documentation

## 2020-11-16 MED ORDER — SODIUM CHLORIDE 0.9 % IV SOLN
510.0000 mg | INTRAVENOUS | Status: DC
  Administered 2020-11-16: 510 mg via INTRAVENOUS
  Filled 2020-11-16: qty 510

## 2020-11-17 ENCOUNTER — Ambulatory Visit: Payer: BC Managed Care – PPO | Admitting: Family Medicine

## 2020-11-21 ENCOUNTER — Encounter: Payer: Self-pay | Admitting: Internal Medicine

## 2020-11-22 NOTE — Telephone Encounter (Signed)
I spoke with pt to advise as indicated. She expressed understanding of this information. Pt states she has an appt with GYN in a couple weeks and will discuss her options moving forward (she is considering a hysterectomy). She also states she already has a follow-up appt with Dr. Julien Nordmann and feels she can wait until then but if anything changes she will call us back.

## 2020-11-28 ENCOUNTER — Other Ambulatory Visit: Payer: Self-pay | Admitting: Obstetrics and Gynecology

## 2020-11-28 DIAGNOSIS — N939 Abnormal uterine and vaginal bleeding, unspecified: Secondary | ICD-10-CM

## 2020-11-28 MED ORDER — MEGESTROL ACETATE 40 MG PO TABS: 80.0000 mg | ORAL_TABLET | Freq: Two times a day (BID) | ORAL | 3 refills | Status: DC

## 2020-11-28 NOTE — Progress Notes (Signed)
Dose increased to 80 mg BID per consult with Dr. Harolyn Rutherford; who the patient will see on 12/14/20.  Laury Deep, CNM 11/28/2020 11:25 AM

## 2020-12-01 ENCOUNTER — Other Ambulatory Visit: Payer: Self-pay | Admitting: Family Medicine

## 2020-12-06 DIAGNOSIS — E119 Type 2 diabetes mellitus without complications: Secondary | ICD-10-CM | POA: Diagnosis not present

## 2020-12-06 LAB — HM DIABETES EYE EXAM

## 2020-12-14 ENCOUNTER — Other Ambulatory Visit (HOSPITAL_COMMUNITY)
Admission: RE | Admit: 2020-12-14 | Discharge: 2020-12-14 | Disposition: A | Discharge status: Home / Self Care | Payer: BC Managed Care – PPO | Source: Ambulatory Visit | Attending: Obstetrics & Gynecology | Admitting: Obstetrics & Gynecology

## 2020-12-14 ENCOUNTER — Other Ambulatory Visit: Payer: Self-pay | Admitting: Obstetrics & Gynecology

## 2020-12-14 ENCOUNTER — Other Ambulatory Visit: Payer: Self-pay

## 2020-12-14 ENCOUNTER — Ambulatory Visit (INDEPENDENT_AMBULATORY_CARE_PROVIDER_SITE_OTHER): Payer: BC Managed Care – PPO | Admitting: Obstetrics & Gynecology

## 2020-12-14 ENCOUNTER — Encounter: Payer: Self-pay | Admitting: Obstetrics & Gynecology

## 2020-12-14 VITALS — BP 156/114 | HR 95 | Ht 64.0 in | Wt 370.0 lb

## 2020-12-14 DIAGNOSIS — N939 Abnormal uterine and vaginal bleeding, unspecified: Secondary | ICD-10-CM

## 2020-12-14 DIAGNOSIS — D259 Leiomyoma of uterus, unspecified: Secondary | ICD-10-CM

## 2020-12-14 DIAGNOSIS — D219 Benign neoplasm of connective and other soft tissue, unspecified: Secondary | ICD-10-CM

## 2020-12-14 LAB — POCT PREGNANCY, URINE: Preg Test, Ur: NEGATIVE

## 2020-12-14 MED ORDER — MEGESTROL ACETATE 40 MG PO TABS: 120.0000 mg | ORAL_TABLET | Freq: Two times a day (BID) | ORAL | 5 refills | Status: DC

## 2020-12-14 NOTE — Progress Notes (Signed)
GYNECOLOGY OFFICE VISIT NOTE  History:   Sarah Faulkner is a 40 y.o. G1P0010 here for endometrial biopsy for AUB and fibroids in the setting of morbid obesity. Had pap smear on 11/28/2020 that was normal with negative HRHPV.  Currently on Megace 80 mg po bid, still has some bleeding daily. Mild cramping reported. No other symptoms. Results of recent ultrasound are below.    Past Medical History:  Diagnosis Date  . Anemia   . Chicken pox   . Depression   . Diabetes mellitus without complication [facility])     Past Surgical History:  Procedure Laterality Date  . CHOLECYSTECTOMY    . gallbladder removed  2005    The following portions of the patient's history were reviewed and updated as appropriate: allergies, current medications, past family history, past medical history, past social history, past surgical history and problem list.    Review of Systems:  Pertinent items noted in HPI and remainder of comprehensive ROS otherwise negative.  Physical Exam:  BP (!) 156/114   Pulse 95   Ht 5\' 4"  (1.626 m)   Wt (!) 370 lb (167.8 kg)   LMP 11/10/2020   BMI 63.51 kg/m  CONSTITUTIONAL: Well-developed, well-nourished female in no acute distress.  NEUROLOGIC: Alert and oriented to person, place, and time. Normal reflexes, muscle tone coordination.  PSYCHIATRIC: Normal mood and affect. Normal behavior. Normal judgment and thought content. CARDIOVASCULAR: Normal heart rate noted, regular rhythm RESPIRATORY: Clear to auscultation bilaterally. Effort and breath sounds normal, no problems with respiration noted. BREASTS: Symmetric in size. No masses, tenderness, skin changes, nipple drainage, or lymphadenopathy bilaterally. ABDOMEN: Soft, obese, enlarged fibroid uterus noted.  No tenderness, rebound or guarding.  PELVIC: Normal appearing external genitalia and urethral meatus. Had to use long Graves speculum.  Normal appearing vaginal mucosa and cervix.  Bloody discharge noted. Done in  presence of a chaperone.  ENDOMETRIAL BIOPSY     The indications for endometrial biopsy were reviewed.   Risks of the biopsy including cramping, bleeding, infection, uterine perforation, inadequate specimen and need for additional procedures  were discussed. The patient states she understands and agrees to undergo procedure today. Consent was signed. Time out was performed. Urine HCG was negative. During the pelvic exam, the cervix was prepped with Betadine. A single-toothed tenaculum was placed on the anterior lip of the cervix to stabilize it. The 3 mm pipelle was introduced into the endometrial cavity without difficulty to a depth of 15 cm, and a moderate amount of tissue was obtained and sent to pathology. The instruments were removed from the patient's vagina. Minimal bleeding from the cervix was noted. The patient tolerated the procedure well. Routine post-procedure instructions were given to the patient.  All specimens were labeled and sent to pathology. Patient was given post procedure instructions.    Labs and Imaging Results for orders placed or performed in visit on 12/14/20 (from the past 168 hour(s))  Pregnancy, urine POC   Collection Time: 12/14/20  2:56 PM  Result Value Ref Range   Preg Test, Ur NEGATIVE NEGATIVE   US PELVIC COMPLETE WITH TRANSVAGINAL  Result Date: 11/08/2020 CLINICAL DATA:  Abnormal uterine bleeding, bleeding stopped since starting Megace 2 weeks ago EXAM: TRANSABDOMINAL AND TRANSVAGINAL ULTRASOUND OF PELVIS TECHNIQUE: Both transabdominal and transvaginal ultrasound examinations of the pelvis were performed. Transabdominal technique was performed for global imaging of the pelvis including uterus, ovaries, adnexal regions, and pelvic cul-de-sac. It was necessary to proceed with endovaginal exam following the transabdominal  exam to visualize the uterus and endometrium. COMPARISON:  07/30/2014 FINDINGS: Uterus Measurements: 18.4 x 11.5 x 14.2 cm = volume: 1568 mL mL.  Markedly enlarged secondary to 2 large uterine leiomyomata measuring 10.7 cm and 7.0 cm in greatest sizes. The larger lesion significantly distorts the upper and mid uterus. Endometrium Obscured due to uterine leiomyomata Right ovary Measurements: 6.1 x 4.1 x 4.1 cm = volume: 53.4 mL. 4.7 cm diameter simple cyst within RIGHT ovary; no follow up imaging recommended. Note: This recommendation does not apply to premenarchal patients or to those with increased risk (genetic, family history, elevated tumor markers or other high-risk factors) of ovarian cancer. Reference: Radiology 2019 Nov; 293(2):359-371. Left ovary Measurements: 3.1 x 2.6 x 3.1 cm = volume: 12.9 mL. Normal morphology without mass Other findings No free pelvic fluid.  No adnexal masses. IMPRESSION: Large uterine leiomyomata measuring 10.7 cm and 7.0 cm in greatest sizes, obscuring endometrial complex. 4.7 cm simple appearing RIGHT ovarian cyst; no follow-up imaging recommended. Electronically Signed   By: Lavonia Dana M.D.   On: 11/08/2020 17:14      Assessment and Plan:      1. Morbid obesity ([place]) 2. Fibroids 3. Abnormal uterine bleeding (AUB) Reviewed ultrasound findings in detail, all questions answered Will follow up endometrial biopsy and manage accordingly; patient will be contacted with results and recommendations. In the meantime, will increase Megace to 120 mg po bid. Also discussed referral to IR for evaluation for uterine fibroid embolization evaluation.  Trying to avoid invasive surgery/hysterectomy given habitus and Type II DM.  - Surgical pathology - megestrol (MEGACE) 40 MG tablet; Take 3 tablets (120 mg total) by mouth 2 (two) times daily.  Dispense: 180 tablet; Refill: 5 - Ambulatory referral to Interventional Radiology  Please refer to After Visit Summary for other counseling recommendations.   Return in about 1 month (around 01/14/2021) for Followup of AUB.    I spent 25 minutes dedicated to the care of this patient  including pre-visit review of records, face to face time with the patient discussing her conditions and treatments and post visit ordering of testing. This was outside time devoted to endometrial biopsy.    Verita Schneiders, MD, [place] [facility] [employer] [facility], [place]

## 2020-12-14 NOTE — Progress Notes (Signed)
[facility] appt scheduled for 12/15/20 @ 1100 via telephone.   Mel Almond, RN  12/14/20

## 2020-12-14 NOTE — Patient Instructions (Addendum)
ENDOMETRIAL BIOPSY POST-PROCEDURE INSTRUCTIONS  1. You may take Ibuprofen, Aleve or Tylenol for pain if needed.  Cramping should resolve within in 24 hours.  2. You may have a small amount of spotting.  You should wear a mini pad for the next few days.  3. You may have intercourse after 24 hours.  4. You need to call if you have any pelvic pain, fever, heavy bleeding or foul smelling vaginal discharge.  5. Shower or bathe as normal  6. We will call you within one week with results or we will discuss   the results at your follow-up appointment if neede Uterine Artery Embolization for Fibroids  Uterine artery embolization is a procedure to shrink uterine fibroids. Uterine fibroids are masses of tissue (tumors) that can develop in the womb (uterus). They are also called leiomyomas. This type of tumor is not cancerous (benign) and does not spread to other parts of the body outside of the pelvic area. The pelvic area is the part of the body between the hip bones. You can have one or many fibroids. Fibroids can vary in size, shape, weight, and where they grow in the uterus. Some can become quite large. In this procedure, a thin plastic tube (catheter) is used to inject a chemical that blocks off the blood supply to the fibroid, which causes the fibroid to shrink. Tell a health care provider about:  Any allergies you have.  All medicines you are taking, including vitamins, herbs, eye drops, creams, and over-the-counter medicines.  Any problems you or family members have had with anesthetic medicines.  Any blood disorders you have.  Any surgeries you have had.  Any medical conditions you have.  Whether you are pregnant or may be pregnant. What are the risks? Generally, this is a safe procedure. However, problems may occur, including:  Bleeding.  Allergic reactions to medicines or dyes.  Damage to other structures or organs.  Infection, including blood infection  (septicemia).  Injury to the uterus from decreased blood supply.  Lack of menstrual periods (amenorrhea).  Death of tissue cells (necrosis) around your bladder or vulva.  Development of a hole between organs or from an organ to the surface of your skin (fistula).  Blood clot in the legs (deep vein thrombosis) or lung (pulmonary embolus).  Nausea and vomiting. What happens before the procedure? Staying hydrated Follow instructions from your health care provider about hydration, which may include:  Up to 2 hours before the procedure - you may continue to drink clear liquids, such as water, clear fruit juice, black coffee, and plain tea. Eating and drinking restrictions Follow instructions from your health care provider about eating and drinking, which may include:  8 hours before the procedure - stop eating heavy meals or foods such as meat, fried foods, or fatty foods.  6 hours before the procedure - stop eating light meals or foods, such as toast or cereal.  6 hours before the procedure - stop drinking milk or drinks that contain milk.  2 hours before the procedure - stop drinking clear liquids. Medicines  Ask your health care provider about: ? Changing or stopping your regular medicines. This is especially important if you are taking diabetes medicines or blood thinners. ? Taking over-the-counter medicines, vitamins, herbs, and supplements. ? Taking medicines such as aspirin and ibuprofen. These medicines can thin your blood. Do not take these medicines unless your health care provider tells you to take them.  You may be given antibiotic medicine to  help prevent infection.  You may be given medicine to prevent nausea and vomiting (antiemetic). General instructions  Ask your health care provider how your surgical site will be marked or identified.  You may be asked to shower with a germ-killing soap.  Plan to have someone take you home from the hospital or clinic.  If  you will be going home right after the procedure, plan to have someone with you for 24 hours.  You will be asked to empty your bladder. What happens during the procedure?  To lower your risk of infection: ? Your health care team will wash or sanitize their hands. ? Hair may be removed from the surgical area. ? Your skin will be washed with soap.  An IV will be inserted into one of your veins.  You will be given one or more of the following: ? A medicine to help you relax (sedative). ? A medicine to numb the area (local anesthetic).  A small cut (incision) will be made in your groin.  A catheter will be inserted into the main artery of your leg. The catheter will be guided through the artery to your uterus.  A series of images will be taken while dye is injected through the catheter in your groin. X-rays are taken at the same time. This is done to provide a road map of the blood supply to your uterus and fibroids.  Tiny plastic spheres, about the size of sand grains, will be injected through the catheter. Metal coils may be used to help block the artery. The particles will lodge in tiny branches of the uterine artery that supplies blood to the fibroids.  The procedure will be repeated on the artery that supplies the other side of the uterus.  The catheter will be removed and pressure will be applied to stop the bleeding.  A dressing will be placed over the incision. The procedure may vary among health care providers and hospitals. What happens after the procedure?  Your blood pressure, heart rate, breathing rate, and blood oxygen level will be monitored until the medicines you were given have worn off.  You will be given pain medicine as needed.  You may be given medicine for nausea and vomiting as needed.  Do not drive for 24 hours if you were given a sedative. Summary  Uterine artery embolization is a procedure to shrink uterine fibroids by blocking the blood supply to the  fibroid.  You may be given a sedative and local anesthetic for the procedure.  A catheter will be inserted into the main artery of your leg. The catheter will be guided through the artery to your uterus.  After the procedure you will be given pain medicine and medicine for nausea as needed.  Do not drive for 24 hours if you were given a sedative. This information is not intended to replace advice given to you by your health care provider. Make sure you discuss any questions you have with your health care provider. Document Revised: 08/23/2017 Document Reviewed: 12/13/2016 Elsevier Patient Education  2021 [employer].

## 2020-12-15 ENCOUNTER — Ambulatory Visit
Admission: RE | Admit: 2020-12-15 | Discharge: 2020-12-15 | Disposition: A | Discharge status: Home / Self Care | Payer: BC Managed Care – PPO | Source: Ambulatory Visit | Attending: Obstetrics & Gynecology | Admitting: Obstetrics & Gynecology

## 2020-12-15 ENCOUNTER — Encounter: Payer: Self-pay | Admitting: *Deleted

## 2020-12-15 DIAGNOSIS — D259 Leiomyoma of uterus, unspecified: Secondary | ICD-10-CM

## 2020-12-15 HISTORY — PX: IR RADIOLOGIST EVAL & MGMT: IMG5224

## 2020-12-15 NOTE — Progress Notes (Signed)
Patient ID: Sarah Faulkner, female   DOB: 08-03-1981, 40 y.o.   MRN: [id number]       Chief Complaint:  Symptomatic uterine fibroids, dysfunctional uterine bleeding  Referring Physician(s): Anyanwu,Ugonna A  History of Present Illness: Sarah Faulkner is a 40 y.o. female with type 2 diabetes, obesity, uterine fibroids and abnormal uterine bleeding.  She is a G0, P0 A0.  She is uncertain of future pregnancy plans.  Review of her last menstrual cycle was 11/10/2020.  Unfortunately she has persistent ongoing menstrual bleeding since that date.  At the beginning of the cycle she does have heavy bleeding with some blood clots.  She also describes urinary frequency and nocturia.  For the irregular now persistent bleeding she was started on Megace yesterday.  No prior fibroid surgeries.  No recent GYN infections.  Pap smear 2-22 was negative.  Endometrial biopsy was performed yesterday, results are pending.  Ultrasound November 08, 2020 confirms large uterine fibroid measuring 10 cm.  Uterus measures up to 18 cm in length.  Past Medical History:  Diagnosis Date  . Anemia   . Chicken pox   . Depression   . Diabetes mellitus without complication [facility])     Past Surgical History:  Procedure Laterality Date  . CHOLECYSTECTOMY    . gallbladder removed  2005    Allergies: Patient has no known allergies.  Medications: Prior to Admission medications   Medication Sig Start Date End Date Taking? Authorizing Provider  Cholecalciferol 25 MCG (1000 UT) tablet Take 2,000 Units by mouth daily.    [provider]  ferrous sulfate 325 (65 FE) MG tablet Take 1 tablet (325 mg total) by mouth daily with breakfast. 12/09/18   Midge Minium, MD  megestrol (MEGACE) 40 MG tablet Take 3 tablets (120 mg total) by mouth 2 (two) times daily. 12/14/20   Anyanwu, Sallyanne Havers, MD  metFORMIN (GLUCOPHAGE) 1000 MG tablet TAKE 1 TABLET(1000 MG) BY MOUTH TWICE DAILY WITH A MEAL 12/01/20   Midge Minium, MD  UNABLE TO FIND Diabetic Supplies:  Glucometer, test strips, lancets. Check glucose BID (fasting and 2 hours after eating). One month supply. Refills PRN. 07/17/11   [provider]  valACYclovir (VALTREX) 1000 MG tablet TAKE 1 TABLET(1000 MG) BY MOUTH DAILY 09/05/20   Midge Minium, MD     Family History  Problem Relation Age of Onset  . Cancer Mother        breast  . Heart disease Maternal Grandmother   . Stroke Maternal Grandmother   . Hypertension Maternal Grandmother   . Breast cancer Neg Hx     Social History   Socioeconomic History  . Marital status: Single    Spouse name: Not on file  . Number of children: Not on file  . Years of education: Not on file  . Highest education level: Associate degree: academic program  Occupational History  . Not on file  Tobacco Use  . Smoking status: Never Smoker  . Smokeless tobacco: Never Used  Vaping Use  . Vaping Use: Never used  Substance and Sexual Activity  . Alcohol use: No  . Drug use: No  . Sexual activity: Yes    Birth control/protection: None  Other Topics Concern  . Not on file  Social History Narrative  . Not on file   Social Determinants of Health   Financial Resource Strain: Not on file  Food Insecurity: No Food Insecurity  . Worried About Charity fundraiser  in the Last Year: Never true  . Ran Out of Food in the Last Year: Never true  Transportation Needs: No Transportation Needs  . Lack of Transportation (Medical): No  . Lack of Transportation (Non-Medical): No  Physical Activity: Not on file  Stress: Not on file  Social Connections: Not on file     Review of Systems  Review of Systems: A 12 point ROS discussed and pertinent positives are indicated in the HPI above.  All other systems are negative.  Physical Exam No direct physical exam was performed, telephone health visit only today because of Covid pandemic Vital Signs: There were no vitals taken for this  visit.  Imaging: No results found.  Labs:  CBC: Recent Labs    10/26/20 1605 11/08/20 1119  WBC 8.2 7.3  HGB 8.0* 8.3*  HCT 28.7* 30.8*  PLT 774* 557*    COAGS: No results for input(s): INR, APTT in the last 8760 hours.  BMP: Recent Labs    04/11/20 1540 11/08/20 1119  NA 134* 134*  K 4.3 4.1  CL 101 102  CO2 25 25  GLUCOSE 151* 116*  BUN 9 8  CALCIUM 8.8 9.2  CREATININE 0.74 0.79  GFRNONAA  --  >60    LIVER FUNCTION TESTS: Recent Labs    11/08/20 0000 11/08/20 1119  BILITOT <0.2 0.4  AST 13 8*  ALT 12 10  ALKPHOS 64 57  PROT 7.7 8.1  ALBUMIN 3.8 3.4*    Assessment and Plan:  Symptomatic uterine fibroids with abnormal uterine bleeding.  History of type 2 diabetes and morbid obesity.  Uterine fibroid embolization procedure was reviewed in detail.  The procedure, risk, benefits and alternatives are reviewed.  The expected goals, recovery and outcome reviewed.  All questions were addressed.  She has a clear understanding of the procedure.  She would like to proceed with the work-up including pelvic MRI without and with contrast.  Plan: Scheduled for pelvic MRI without and with contrast in the next few weeks to assess fibroid anatomy for embolization    Thank you for this interesting consult.  I greatly enjoyed meeting Sarah Faulkner and look forward to participating in their care.  A copy of this report was sent to the requesting provider on this date.  Electronically Signed: Greggory Keen 12/15/2020, 11:46 AM   I spent a total of  40 Minutes   in remote  clinical consultation, greater than 50% of which was counseling/coordinating care for this patient with symptomatic uterine fibroids and abnormal bleeding.    Visit type: Audio only (telephone). Audio (no video) only due to patient's lack of internet/smartphone capability. Alternative for in-person consultation at [facility], [place] [place], [place], [place]. This visit type was  conducted due to national recommendations for restrictions regarding the COVID-19 Pandemic (e.g. social distancing).  This format is felt to be most appropriate for this patient at this time.  All issues noted in this document were discussed and addressed.

## 2020-12-16 ENCOUNTER — Other Ambulatory Visit: Payer: Self-pay | Admitting: Interventional Radiology

## 2020-12-16 DIAGNOSIS — D250 Submucous leiomyoma of uterus: Secondary | ICD-10-CM

## 2020-12-19 LAB — SURGICAL PATHOLOGY

## 2020-12-20 ENCOUNTER — Inpatient Hospital Stay: Payer: BC Managed Care – PPO | Admitting: Internal Medicine

## 2020-12-20 ENCOUNTER — Inpatient Hospital Stay: Payer: BC Managed Care – PPO

## 2020-12-20 ENCOUNTER — Telehealth: Payer: Self-pay | Admitting: Internal Medicine

## 2020-12-20 NOTE — Telephone Encounter (Signed)
R/s appts per 3/29 sch msg. Pt aware.

## 2020-12-21 ENCOUNTER — Ambulatory Visit: Payer: BC Managed Care – PPO | Admitting: Family Medicine

## 2020-12-21 DIAGNOSIS — Z0289 Encounter for other administrative examinations: Secondary | ICD-10-CM

## 2020-12-23 ENCOUNTER — Ambulatory Visit (INDEPENDENT_AMBULATORY_CARE_PROVIDER_SITE_OTHER): Payer: BC Managed Care – PPO | Admitting: Obstetrics and Gynecology

## 2020-12-23 ENCOUNTER — Other Ambulatory Visit: Payer: Self-pay

## 2020-12-23 ENCOUNTER — Encounter: Payer: Self-pay | Admitting: Obstetrics and Gynecology

## 2020-12-23 VITALS — BP 177/98 | HR 71 | Ht 64.0 in | Wt 371.1 lb

## 2020-12-23 DIAGNOSIS — N939 Abnormal uterine and vaginal bleeding, unspecified: Secondary | ICD-10-CM | POA: Diagnosis not present

## 2020-12-23 DIAGNOSIS — I10 Essential (primary) hypertension: Secondary | ICD-10-CM | POA: Diagnosis not present

## 2020-12-23 MED ORDER — NORETHINDRONE ACETATE 5 MG PO TABS: ORAL_TABLET | ORAL | 1 refills | Status: DC

## 2020-12-23 NOTE — Progress Notes (Signed)
Obstetrics and Gynecology Visit Return Patient Evaluation  Appointment Date: 12/23/2020  Primary Care Provider: [facility]  Chief Complaint: add on visit for AUB  History of Present Illness:  Sarah Faulkner is a 40 y.o. with above CC. Patient messaged yesterday that she was having increased VB that was worse after increasing the megace. She was seen by Dr. Harolyn Rutherford on 3/23 and had a negative embx and had her megace increased and she was set up to see IR. Pt states she was taking megace 120 bid but stopped a few days ago b/c it made her AUB worse; she is currently having 2-3 pad per day bleeding with no s/s of anemia.  She has seen IR and is set for an MRI in mid April to see if she is a candidate for [place].  Review of Systems:  as noted in the History of Present Illness.  Patient Active Problem List   Diagnosis Date Noted  . Hypertension   . Anxiety and depression 01/23/2019  . Family history of breast cancer in mother 05/23/2017  . Vitamin D deficiency 05/23/2017  . Chronic iron deficiency anemia 07/21/2015  . Menorrhagia 07/21/2015  . Radicular low back pain 07/15/2015  . Physical exam 09/29/2013  . Diabetes mellitus type II, controlled, with no complications ([place]) 58/05/9832  . Morbid obesity ([place]) 12/16/2012  . Pain in right shoulder 12/16/2012   Medications:  Teneka D. Affinito had no medications administered during this visit. Current Outpatient Medications  Medication Sig Dispense Refill  . ferrous sulfate 325 (65 FE) MG tablet Take 1 tablet (325 mg total) by mouth daily with breakfast. 30 tablet 5  . metFORMIN (GLUCOPHAGE) 1000 MG tablet TAKE 1 TABLET(1000 MG) BY MOUTH TWICE DAILY WITH A MEAL 180 tablet 0  . UNABLE TO FIND Diabetic Supplies:  Glucometer, test strips, lancets. Check glucose BID (fasting and 2 hours after eating). One month supply. Refills PRN.    . valACYclovir (VALTREX) 1000 MG  tablet TAKE 1 TABLET(1000 MG) BY MOUTH DAILY 30 tablet 3  . Cholecalciferol 25 MCG (1000 UT) tablet Take 2,000 Units by mouth daily. (Patient not taking: Reported on 12/23/2020)     No current facility-administered medications for this visit.    Allergies: has No Known Allergies.  Physical Exam:  BP (!) 177/98   Pulse 71   Ht 5\' 4"  (1.626 m)   Wt (!) 371 lb 1.6 oz (168.3 kg)   LMP 11/10/2020 (Exact Date)   BMI 63.70 kg/m  Body mass index is 63.7 kg/m. General appearance: Well nourished, well developed female in no acute distress.  CV: normal s1 and s2, no MRGs Pulm: CTAB Abdomen: obese, ntttp, nd Neuro/Psych:  Normal mood and affect.    Assessment: pt stable  Plan:  1. Hypertension, unspecified type D/w her to contact PCP re: better control. Pt is asymptomatic  2. Abnormal uterine bleeding (AUB) Will check CBC; pt has received IV iron x2 in Feb 2022. I recommend she switch to Aygestin; pt told to let us know if not covered by insurance as will have to try and get it approved or switch to something else.   Per RN staff, Dr. Harolyn Rutherford just sent a message requesting pt be set up for depot lupron. I briefly d/w her but said that Dr. Harolyn Rutherford can talk to her more about this. Will RTC in 2 weeks for f/u with Dr. Harolyn Rutherford and she can initiate therapy then if  so desired - Anemia Profile B   RTC: 2-3wks for follow up with Dr. Toni Arthurs MD Attending Center for [facility])

## 2020-12-24 LAB — ANEMIA PROFILE B
Basophils Absolute: 0 10*3/uL (ref 0.0–0.2)
Basos: 0 %
EOS (ABSOLUTE): 0.1 10*3/uL (ref 0.0–0.4)
Eos: 2 %
Ferritin: 61 ng/mL (ref 15–150)
Folate: 13.9 ng/mL (ref >3.0)
Hematocrit: 36.2 % (ref 34.0–46.6)
Hemoglobin: 11.2 g/dL (ref 11.1–15.9)
Immature Grans (Abs): 0 10*3/uL (ref 0.0–0.1)
Immature Granulocytes: 1 %
Iron Saturation: 10 % — ABNORMAL LOW (ref 15–55)
Iron: 31 ug/dL (ref 27–159)
Lymphocytes Absolute: 2.1 10*3/uL (ref 0.7–3.1)
Lymphs: 26 %
MCH: 21.8 pg — ABNORMAL LOW (ref 26.6–33.0)
MCHC: 30.9 g/dL — ABNORMAL LOW (ref 31.5–35.7)
MCV: 70 fL — ABNORMAL LOW (ref 79–97)
Monocytes Absolute: 0.5 10*3/uL (ref 0.1–0.9)
Monocytes: 7 %
Neutrophils Absolute: 5.1 10*3/uL (ref 1.4–7.0)
Neutrophils: 64 %
Platelets: 506 10*3/uL — ABNORMAL HIGH (ref 150–450)
RBC: 5.14 x10E6/uL (ref 3.77–5.28)
RDW: 27.3 % — ABNORMAL HIGH (ref 11.7–15.4)
Retic Ct Pct: 1.4 % (ref 0.6–2.6)
Total Iron Binding Capacity: 310 ug/dL (ref 250–450)
UIBC: 279 ug/dL (ref 131–425)
Vitamin B-12: 322 pg/mL (ref 232–1245)
WBC: 7.9 10*3/uL (ref 3.4–10.8)

## 2020-12-26 ENCOUNTER — Ambulatory Visit (HOSPITAL_COMMUNITY): Payer: BC Managed Care – PPO

## 2021-01-02 ENCOUNTER — Encounter: Payer: Self-pay | Admitting: Family Medicine

## 2021-01-02 ENCOUNTER — Inpatient Hospital Stay: Payer: BC Managed Care – PPO

## 2021-01-02 ENCOUNTER — Inpatient Hospital Stay: Payer: BC Managed Care – PPO | Attending: Internal Medicine | Admitting: Internal Medicine

## 2021-01-09 ENCOUNTER — Other Ambulatory Visit: Payer: Self-pay | Admitting: Obstetrics and Gynecology

## 2021-01-09 MED ORDER — NORETHINDRONE ACETATE 5 MG PO TABS: ORAL_TABLET | ORAL | 1 refills | Status: DC

## 2021-01-11 ENCOUNTER — Ambulatory Visit (HOSPITAL_COMMUNITY)
Admission: RE | Admit: 2021-01-11 | Discharge: 2021-01-11 | Disposition: A | Discharge status: Home / Self Care | Payer: BC Managed Care – PPO | Source: Ambulatory Visit | Attending: Interventional Radiology | Admitting: Interventional Radiology

## 2021-01-11 ENCOUNTER — Encounter (HOSPITAL_COMMUNITY): Payer: Self-pay

## 2021-01-11 DIAGNOSIS — D250 Submucous leiomyoma of uterus: Secondary | ICD-10-CM

## 2021-01-15 ENCOUNTER — Other Ambulatory Visit: Payer: Self-pay

## 2021-01-15 ENCOUNTER — Ambulatory Visit (HOSPITAL_COMMUNITY): Admission: RE | Admit: 2021-01-15 | Payer: BC Managed Care – PPO | Source: Ambulatory Visit

## 2021-01-15 ENCOUNTER — Encounter (HOSPITAL_COMMUNITY): Payer: Self-pay

## 2021-01-16 ENCOUNTER — Telehealth: Payer: Self-pay

## 2021-01-16 NOTE — Progress Notes (Signed)
2 tabs 5 mg valium called into pts pharmacy for upcoming MRI on 01/29/21 at 8 am per Dr. Annamaria Boots. Spoke to pt regarding instructions of prescription, pt verbalized understanding. Pt also advised to have a driver the day of taking the valium, pt verbalized understanding.

## 2021-01-29 ENCOUNTER — Ambulatory Visit (HOSPITAL_COMMUNITY)
Admission: RE | Admit: 2021-01-29 | Discharge: 2021-01-29 | Disposition: A | Discharge status: Home / Self Care | Payer: BC Managed Care – PPO | Source: Ambulatory Visit | Attending: Interventional Radiology | Admitting: Interventional Radiology

## 2021-01-29 ENCOUNTER — Other Ambulatory Visit: Payer: Self-pay

## 2021-01-29 DIAGNOSIS — D259 Leiomyoma of uterus, unspecified: Secondary | ICD-10-CM | POA: Diagnosis not present

## 2021-01-29 DIAGNOSIS — D250 Submucous leiomyoma of uterus: Secondary | ICD-10-CM | POA: Diagnosis not present

## 2021-01-29 MED ORDER — GADOBUTROL 1 MMOL/ML IV SOLN
10.0000 mL | Freq: Once | INTRAVENOUS | Status: AC | PRN
  Administered 2021-01-29: 10 mL via INTRAVENOUS

## 2021-02-01 ENCOUNTER — Other Ambulatory Visit: Payer: Self-pay | Admitting: Obstetrics & Gynecology

## 2021-02-01 ENCOUNTER — Ambulatory Visit: Payer: BC Managed Care – PPO | Admitting: Obstetrics & Gynecology

## 2021-02-01 ENCOUNTER — Ambulatory Visit (INDEPENDENT_AMBULATORY_CARE_PROVIDER_SITE_OTHER): Payer: BC Managed Care – PPO | Admitting: Obstetrics & Gynecology

## 2021-02-01 ENCOUNTER — Encounter: Payer: Self-pay | Admitting: Obstetrics & Gynecology

## 2021-02-01 ENCOUNTER — Other Ambulatory Visit: Payer: Self-pay

## 2021-02-01 VITALS — BP 175/107 | HR 73 | Ht 64.0 in | Wt 368.7 lb

## 2021-02-01 DIAGNOSIS — D219 Benign neoplasm of connective and other soft tissue, unspecified: Secondary | ICD-10-CM | POA: Diagnosis not present

## 2021-02-01 DIAGNOSIS — N939 Abnormal uterine and vaginal bleeding, unspecified: Secondary | ICD-10-CM

## 2021-02-01 MED ORDER — NORETHINDRONE ACETATE 5 MG PO TABS: 15.0000 mg | ORAL_TABLET | Freq: Every day | ORAL | 1 refills | Status: DC

## 2021-02-01 NOTE — Progress Notes (Signed)
GYNECOLOGY OFFICE VISIT NOTE  History:   Sarah Faulkner is a 40 y.o. G1P0010 with history of T2DM, morbid obesity, HTN here today for follow up of AUB and fibroids.  Currently in the process of being evaluated by IR for UFE, had MRI on 01/29/21.  AUB was controlled on Aygestin, but she ran out of this and her insurance did not let her get a refill according to pharmacy. Heavy bleeding has resumed, she wants Aygestin refill.  Benign endometrial biopsy on 12/14/2020. She denies any pelvic pain or other concerns.    Past Medical History:  Diagnosis Date  . Anemia   . Chicken pox   . Depression   . DM (diabetes mellitus), type 2 ([place])   . Hypertension     Past Surgical History:  Procedure Laterality Date  . CHOLECYSTECTOMY  2005  . IR RADIOLOGIST EVAL & MGMT  12/15/2020    The following portions of the patient's history were reviewed and updated as appropriate: allergies, current medications, past family history, past medical history, past social history, past surgical history and problem list.   Health Maintenance:  Normal pap and negative HRHPV on 10/26/2020.  Review of Systems:  Pertinent items noted in HPI and remainder of comprehensive ROS otherwise negative.  Physical Exam:  BP (!) 175/107 (BP Location: Left Wrist, Patient Position: Sitting, Cuff Size: Large)   Pulse 73   Ht 5\' 4"  (1.626 m)   Wt (!) 368 lb 11.2 oz (167.2 kg)   BMI 63.29 kg/m  CONSTITUTIONAL: Well-developed, well-nourished female in no acute distress.  HEENT:  Normocephalic, atraumatic. External right and left ear normal. No scleral icterus.  NECK: Normal range of motion, supple, no masses noted on observation SKIN: No rash noted. Not diaphoretic. No erythema. No pallor. MUSCULOSKELETAL: Normal range of motion. No edema noted. NEUROLOGIC: Alert and oriented to person, place, and time. Normal muscle tone coordination. No cranial nerve deficit noted. PSYCHIATRIC: Normal mood and affect. Normal behavior.  Normal judgment and thought content. CARDIOVASCULAR: Normal heart rate noted RESPIRATORY: Effort and breath sounds normal, no problems with respiration noted ABDOMEN: No masses noted. No other overt distention noted.   PELVIC: Deferred  Labs and Imaging CBC Latest Ref Rng & Units 12/23/2020 11/08/2020 10/26/2020  WBC 3.4 - 10.8 x10E3/uL 7.9 7.3 8.2  Hemoglobin 11.1 - 15.9 g/dL 11.2 8.3(L) 8.0(L)  Hematocrit 34.0 - 46.6 % 36.2 30.8(L) 28.7(L)  Platelets 150 - 450 x10E3/uL 506(H) 557(H) 774(H)    MR PELVIS W WO CONTRAST  Result Date: 01/29/2021 CLINICAL DATA:  Uterine fibroids EXAM: MRI PELVIS WITHOUT AND WITH CONTRAST TECHNIQUE: Multiplanar multisequence MR imaging of the pelvis was performed both before and after administration of intravenous contrast. CONTRAST:  91mL GADAVIST GADOBUTROL 1 MMOL/ML IV SOLN COMPARISON:  Pelvic ultrasound, 11/08/2020 FINDINGS: Urinary Tract:  No abnormality visualized. Bowel:  Unremarkable visualized pelvic bowel loops. Vascular/Lymphatic: No pathologically enlarged lymph nodes. No significant vascular abnormality seen. Reproductive: Multiple bulky uterine fibroids, largest of the anterior uterine body and fundus measuring 12.6 x 9.0 x 11.8 cm, with a large submucosal component (series 7, image 18). There is an additional large, right eccentric fibroid of the lower uterine segment, likewise with a substantial submucosal component, measuring 8.0 x 5.5 x 6.4 cm (series 7, image 26). Multiple additional smaller fibroids. The overall dimensions of the uterus are approximately 17.2 x 14.4 x 14.7 cm. Other:  None. Musculoskeletal: No suspicious bone lesions identified. IMPRESSION: 1. Multiple bulky uterine fibroids, largest of which is  in the anterior uterine body and fundus measuring 12.6 x 9.0 x 11.8 cm, with a large submucosal component. 2. There is an additional large, right eccentric fibroid of the lower uterine segment, likewise with a substantial submucosal component,  measuring 8.0 x 5.5 x 6.4 cm. 3. Additional smaller fibroids. The overall dimensions of the uterus are approximately 17.2 x 14.4 x 14.7 cm. Electronically Signed   By: Eddie Candle M.D.   On: 01/29/2021 20:48      Assessment and Plan:      1. Abnormal uterine bleeding (AUB) 2. Fibroids 3. Morbid obesity Aygestin refilled, hopefully her insurance will allow her to get this. If prior authorization is needed, the office staff will help get this.  Will recheck labs, will follow up results and manage accordingly. - norethindrone (AYGESTIN) 5 MG tablet; Take 3 tablets (15 mg total) by mouth daily.  Dispense: 90 tablet; Refill: 1 - CBC - Ferritin Patient to contact IR and let them know MRI is done. Hopefully she qualifies for UFE. If not, may have to consider hysterectomy. Worried about invasive surgery given her co-morbidities but may have no other choice if unable to get UFE.  Bleeding precautions reviewed.  Please refer to After Visit Summary for other counseling recommendations.   Return for any gynecologic concerns.    I spent 20 minutes dedicated to the care of this patient including pre-visit review of records, face to face time with the patient discussing her conditions and treatments and post visit ordering of testing.    Verita Schneiders, MD, [place] [facility] [employer], [place]

## 2021-02-01 NOTE — Patient Instructions (Signed)
Uterine Artery Embolization for Fibroids  Uterine artery embolization is a procedure to shrink uterine fibroids. Uterine fibroids are masses of tissue (tumors) that can develop in the womb (uterus). They are also called leiomyomas. This type of tumor is not cancerous (benign) and does not spread to other parts of the body outside of the pelvic area. The pelvic area is the part of the body between the hip bones. You can have one or many fibroids. Fibroids can vary in size, shape, weight, and where they grow in the uterus. Some can become quite large. In this procedure, a thin plastic tube (catheter) is used to inject a chemical that blocks off the blood supply to the fibroid, which causes the fibroid to shrink. Tell a health care provider about:  Any allergies you have.  All medicines you are taking, including vitamins, herbs, eye drops, creams, and over-the-counter medicines.  Any problems you or family members have had with anesthetic medicines.  Any blood disorders you have.  Any surgeries you have had.  Any medical conditions you have.  Whether you are pregnant or may be pregnant. What are the risks? Generally, this is a safe procedure. However, problems may occur, including:  Bleeding.  Allergic reactions to medicines or dyes.  Damage to other structures or organs.  Infection, including blood infection (septicemia).  Injury to the uterus from decreased blood supply.  Lack of menstrual periods (amenorrhea).  Death of tissue cells (necrosis) around your bladder or vulva.  Development of a hole between organs or from an organ to the surface of your skin (fistula).  Blood clot in the legs (deep vein thrombosis) or lung (pulmonary embolus).  Nausea and vomiting. What happens before the procedure? Staying hydrated Follow instructions from your health care provider about hydration, which may include:  Up to 2 hours before the procedure - you may continue to drink clear  liquids, such as water, clear fruit juice, black coffee, and plain tea. Eating and drinking restrictions Follow instructions from your health care provider about eating and drinking, which may include:  8 hours before the procedure - stop eating heavy meals or foods such as meat, fried foods, or fatty foods.  6 hours before the procedure - stop eating light meals or foods, such as toast or cereal.  6 hours before the procedure - stop drinking milk or drinks that contain milk.  2 hours before the procedure - stop drinking clear liquids. Medicines  Ask your health care provider about: ? Changing or stopping your regular medicines. This is especially important if you are taking diabetes medicines or blood thinners. ? Taking over-the-counter medicines, vitamins, herbs, and supplements. ? Taking medicines such as aspirin and ibuprofen. These medicines can thin your blood. Do not take these medicines unless your health care provider tells you to take them.  You may be given antibiotic medicine to help prevent infection.  You may be given medicine to prevent nausea and vomiting (antiemetic). General instructions  Ask your health care provider how your surgical site will be marked or identified.  You may be asked to shower with a germ-killing soap.  Plan to have someone take you home from the hospital or clinic.  If you will be going home right after the procedure, plan to have someone with you for 24 hours.  You will be asked to empty your bladder. What happens during the procedure?  To lower your risk of infection: ? Your health care team will wash or sanitize their hands. ?   Hair may be removed from the surgical area. ? Your skin will be washed with soap.  An IV will be inserted into one of your veins.  You will be given one or more of the following: ? A medicine to help you relax (sedative). ? A medicine to numb the area (local anesthetic).  A small cut (incision) will be made  in your groin.  A catheter will be inserted into the main artery of your leg. The catheter will be guided through the artery to your uterus.  A series of images will be taken while dye is injected through the catheter in your groin. X-rays are taken at the same time. This is done to provide a road map of the blood supply to your uterus and fibroids.  Tiny plastic spheres, about the size of sand grains, will be injected through the catheter. Metal coils may be used to help block the artery. The particles will lodge in tiny branches of the uterine artery that supplies blood to the fibroids.  The procedure will be repeated on the artery that supplies the other side of the uterus.  The catheter will be removed and pressure will be applied to stop the bleeding.  A dressing will be placed over the incision. The procedure may vary among health care providers and hospitals. What happens after the procedure?  Your blood pressure, heart rate, breathing rate, and blood oxygen level will be monitored until the medicines you were given have worn off.  You will be given pain medicine as needed.  You may be given medicine for nausea and vomiting as needed.  Do not drive for 24 hours if you were given a sedative. Summary  Uterine artery embolization is a procedure to shrink uterine fibroids by blocking the blood supply to the fibroid.  You may be given a sedative and local anesthetic for the procedure.  A catheter will be inserted into the main artery of your leg. The catheter will be guided through the artery to your uterus.  After the procedure you will be given pain medicine and medicine for nausea as needed.  Do not drive for 24 hours if you were given a sedative. This information is not intended to replace advice given to you by your health care provider. Make sure you discuss any questions you have with your health care provider. Document Revised: 08/23/2017 Document Reviewed:  12/13/2016 Elsevier Patient Education  2021 [employer].

## 2021-02-02 ENCOUNTER — Other Ambulatory Visit: Payer: Self-pay | Admitting: Family Medicine

## 2021-02-02 LAB — CBC
Hematocrit: 33.9 % — ABNORMAL LOW (ref 34.0–46.6)
Hemoglobin: 10.6 g/dL — ABNORMAL LOW (ref 11.1–15.9)
MCH: 22.6 pg — ABNORMAL LOW (ref 26.6–33.0)
MCHC: 31.3 g/dL — ABNORMAL LOW (ref 31.5–35.7)
MCV: 72 fL — ABNORMAL LOW (ref 79–97)
Platelets: 547 10*3/uL — ABNORMAL HIGH (ref 150–450)
RBC: 4.68 x10E6/uL (ref 3.77–5.28)
RDW: 18.8 % — ABNORMAL HIGH (ref 11.7–15.4)
WBC: 8.3 10*3/uL (ref 3.4–10.8)

## 2021-02-02 LAB — FERRITIN: Ferritin: 13 ng/mL — ABNORMAL LOW (ref 15–150)

## 2021-02-02 NOTE — Telephone Encounter (Signed)
LFD 09/05/20 #30 with 3 refills LOV 10/12/20 NOV 02/06/21

## 2021-02-06 ENCOUNTER — Other Ambulatory Visit: Payer: Self-pay

## 2021-02-06 ENCOUNTER — Ambulatory Visit (INDEPENDENT_AMBULATORY_CARE_PROVIDER_SITE_OTHER): Payer: BC Managed Care – PPO | Admitting: Family Medicine

## 2021-02-06 ENCOUNTER — Encounter: Payer: Self-pay | Admitting: Family Medicine

## 2021-02-06 VITALS — BP 150/100 | HR 81 | Temp 97.1°F | Resp 18 | Ht 64.0 in | Wt 373.8 lb

## 2021-02-06 DIAGNOSIS — Z0000 Encounter for general adult medical examination without abnormal findings: Secondary | ICD-10-CM

## 2021-02-06 DIAGNOSIS — I10 Essential (primary) hypertension: Secondary | ICD-10-CM | POA: Diagnosis not present

## 2021-02-06 DIAGNOSIS — E119 Type 2 diabetes mellitus without complications: Secondary | ICD-10-CM | POA: Diagnosis not present

## 2021-02-06 DIAGNOSIS — E559 Vitamin D deficiency, unspecified: Secondary | ICD-10-CM

## 2021-02-06 LAB — LIPID PANEL
Cholesterol: 87 mg/dL (ref 0–200)
HDL: 44.3 mg/dL (ref >39.00)
LDL Cholesterol: 34 mg/dL (ref 0–99)
NonHDL: 42.52
Total CHOL/HDL Ratio: 2
Triglycerides: 41 mg/dL (ref 0.0–149.0)
VLDL: 8.2 mg/dL (ref 0.0–40.0)

## 2021-02-06 LAB — CBC WITH DIFFERENTIAL/PLATELET
Basophils Absolute: 0.1 10*3/uL (ref 0.0–0.1)
Basophils Relative: 0.9 % (ref 0.0–3.0)
Eosinophils Absolute: 0.1 10*3/uL (ref 0.0–0.7)
Eosinophils Relative: 1.8 % (ref 0.0–5.0)
HCT: 31.2 % — ABNORMAL LOW (ref 36.0–46.0)
Hemoglobin: 10.1 g/dL — ABNORMAL LOW (ref 12.0–15.0)
Lymphocytes Relative: 26.5 % (ref 12.0–46.0)
Lymphs Abs: 1.7 10*3/uL (ref 0.7–4.0)
MCHC: 32.4 g/dL (ref 30.0–36.0)
MCV: 71.5 fl — ABNORMAL LOW (ref 78.0–100.0)
Monocytes Absolute: 0.4 10*3/uL (ref 0.1–1.0)
Monocytes Relative: 6.2 % (ref 3.0–12.0)
Neutro Abs: 4.2 10*3/uL (ref 1.4–7.7)
Neutrophils Relative %: 64.6 % (ref 43.0–77.0)
Platelets: 475 10*3/uL — ABNORMAL HIGH (ref 150.0–400.0)
RBC: 4.36 Mil/uL (ref 3.87–5.11)
RDW: 19.8 % — ABNORMAL HIGH (ref 11.5–15.5)
WBC: 6.5 10*3/uL (ref 4.0–10.5)

## 2021-02-06 LAB — HEPATIC FUNCTION PANEL
ALT: 11 U/L (ref 0–35)
AST: 8 U/L (ref 0–37)
Albumin: 3.7 g/dL (ref 3.5–5.2)
Alkaline Phosphatase: 50 U/L (ref 39–117)
Bilirubin, Direct: 0.1 mg/dL (ref 0.0–0.3)
Total Bilirubin: 0.3 mg/dL (ref 0.2–1.2)
Total Protein: 7.1 g/dL (ref 6.0–8.3)

## 2021-02-06 LAB — BASIC METABOLIC PANEL
BUN: 10 mg/dL (ref 6–23)
CO2: 27 mEq/L (ref 19–32)
Calcium: 9.1 mg/dL (ref 8.4–10.5)
Chloride: 102 mEq/L (ref 96–112)
Creatinine, Ser: 0.72 mg/dL (ref 0.40–1.20)
GFR: 105.1 mL/min (ref >60.00)
Glucose, Bld: 123 mg/dL — ABNORMAL HIGH (ref 70–99)
Potassium: 4.5 mEq/L (ref 3.5–5.1)
Sodium: 137 mEq/L (ref 135–145)

## 2021-02-06 LAB — VITAMIN D 25 HYDROXY (VIT D DEFICIENCY, FRACTURES): VITD: 84.84 ng/mL (ref 30.00–100.00)

## 2021-02-06 LAB — TSH: TSH: 2.44 u[IU]/mL (ref 0.35–4.50)

## 2021-02-06 LAB — HEMOGLOBIN A1C: Hgb A1c MFr Bld: 7.4 % — ABNORMAL HIGH (ref 4.6–6.5)

## 2021-02-06 MED ORDER — VALSARTAN 80 MG PO TABS: 80.0000 mg | ORAL_TABLET | Freq: Every day | ORAL | 3 refills | Status: DC

## 2021-02-06 NOTE — Progress Notes (Signed)
   Subjective:    Patient ID: Sarah Faulkner, female    DOB: 08-Sep-1981, 40 y.o.   MRN: [id number]  HPI CPE- UTD on flu, COVID.  UTD on pap  Reviewed past medical, surgical, family and social histories.  HTN- BP is high today and was elevated on 5/11 (175/107).  Pt started taking OCPs in April.  Pt isn't sure which came first- elevated BP or OCPs.    DM- ongoing issue for pt.  On Metformin.  UTD on eye exam (March 2022).  Due for foot exam.  UTD on microalbumin  Health Maintenance  Topic Date Due  . FOOT EXAM  12/09/2020  . INFLUENZA VACCINE  04/24/2021  . HEMOGLOBIN A1C  05/08/2021  . TETANUS/TDAP  06/24/2021  . OPHTHALMOLOGY EXAM  12/06/2021  . PAP SMEAR-Modifier  10/27/2023  . PNEUMOCOCCAL POLYSACCHARIDE VACCINE AGE 56-64 HIGH RISK  Completed  . COVID-19 Vaccine  Completed  . Hepatitis C Screening  Completed  . HIV Screening  Completed  . HPV VACCINES  Aged Out      Review of Systems Patient reports no vision/ hearing changes, adenopathy,fever, weight change,  persistant/recurrent hoarseness , swallowing issues, chest pain, palpitations, edema, persistant/recurrent cough, hemoptysis, dyspnea (rest/exertional/paroxysmal nocturnal), gastrointestinal bleeding (melena, rectal bleeding), abdominal pain, significant heartburn, bowel changes, GU symptoms (dysuria, hematuria, incontinence),  syncope, focal weakness, memory loss, numbness & tingling, skin/hair/nail changes, abnormal bruising, anxiety, or depression.   + fibroids + menorrhagia  This visit occurred during the SARS-CoV-2 public health emergency.  Safety protocols were in place, including screening questions prior to the visit, additional usage of staff PPE, and extensive cleaning of exam room while observing appropriate contact time as indicated for disinfecting solutions.       Objective:   Physical Exam General Appearance:    Alert, cooperative, no distress, appears stated age, morbidly obese  Head:     Normocephalic, without obvious abnormality, atraumatic  Eyes:    PERRL, conjunctiva/corneas clear, EOM's intact, fundi    benign, both eyes  Ears:    Normal TM's and external ear canals, both ears  Nose:   Deferred due to COVID  Throat:   Neck:   Supple, symmetrical, trachea midline, no adenopathy;    Thyroid: no enlargement/tenderness/nodules  Back:     Symmetric, no curvature, ROM normal, no CVA tenderness  Lungs:     Clear to auscultation bilaterally, respirations unlabored  Chest Wall:    No tenderness or deformity   Heart:    Regular rate and rhythm, S1 and S2 normal, no murmur, rub   or gallop  Breast Exam:    Deferred to GYN  Abdomen:     Soft, non-tender, bowel sounds active all four quadrants,    no masses, no organomegaly  Genitalia:    Deferred to GYN  Rectal:    Extremities:   Extremities normal, atraumatic, no cyanosis or edema  Pulses:   2+ and symmetric all extremities  Skin:   Skin color, texture, turgor normal, no rashes or lesions  Lymph nodes:   Cervical, supraclavicular, and axillary nodes normal  Neurologic:   CNII-XII intact, normal strength, sensation and reflexes    throughout          Assessment & Plan:

## 2021-02-06 NOTE — Assessment & Plan Note (Signed)
Deteriorated.  BP is again elevated today.  Suspect this is due to the OCPs she is now taking for her menorrhagia.  Hopefully after her embolization she can stop the pills.  In the meantime, will start Valsartan once daily to improve BP.  Pt expressed understanding and is in agreement w/ plan.

## 2021-02-06 NOTE — Patient Instructions (Addendum)
Follow up in 1 month to recheck BP We'll notify you of your lab results and make any changes if needed Start the Valsartan once daily for BP Continue to work on healthy diet and regular exercise- you can do it!! Call with any questions or concerns Stay Safe!  Stay Healthy! GOOD LUCK!!!

## 2021-02-06 NOTE — Assessment & Plan Note (Signed)
Pt's PE WNL w/ exception of morbid obesity.  We again talked about the need for healthy diet and regular exercise.  UTD on pap, eye exam, immunizations.  Check labs.  Anticipatory guidance provided.

## 2021-02-06 NOTE — Assessment & Plan Note (Signed)
Check labs and replete prn. 

## 2021-02-06 NOTE — Assessment & Plan Note (Signed)
Chronic problem.  On Metformin w/o difficulty.  UTD on eye exam, foot exam done today.  Stressed need for healthy diet and regular exercise.  Check labs.  Adjust meds prn

## 2021-02-14 ENCOUNTER — Other Ambulatory Visit (HOSPITAL_COMMUNITY): Payer: Self-pay | Admitting: Interventional Radiology

## 2021-02-14 DIAGNOSIS — D259 Leiomyoma of uterus, unspecified: Secondary | ICD-10-CM

## 2021-02-16 ENCOUNTER — Ambulatory Visit (HOSPITAL_COMMUNITY): Admission: RE | Admit: 2021-02-16 | Payer: BC Managed Care – PPO | Source: Ambulatory Visit

## 2021-03-01 ENCOUNTER — Other Ambulatory Visit: Payer: Self-pay | Admitting: Family Medicine

## 2021-03-06 ENCOUNTER — Encounter: Payer: Self-pay | Admitting: Family Medicine

## 2021-03-06 ENCOUNTER — Other Ambulatory Visit: Payer: Self-pay

## 2021-03-06 ENCOUNTER — Ambulatory Visit: Payer: BC Managed Care – PPO | Admitting: Family Medicine

## 2021-03-06 VITALS — BP 160/81 | HR 82 | Temp 97.3°F | Resp 18 | Ht 64.0 in | Wt 364.8 lb

## 2021-03-06 DIAGNOSIS — I10 Essential (primary) hypertension: Secondary | ICD-10-CM

## 2021-03-06 LAB — BASIC METABOLIC PANEL
BUN: 10 mg/dL (ref 6–23)
CO2: 24 mEq/L (ref 19–32)
Calcium: 9.2 mg/dL (ref 8.4–10.5)
Chloride: 104 mEq/L (ref 96–112)
Creatinine, Ser: 0.8 mg/dL (ref 0.40–1.20)
GFR: 92.57 mL/min (ref >60.00)
Glucose, Bld: 87 mg/dL (ref 70–99)
Potassium: 4.4 mEq/L (ref 3.5–5.1)
Sodium: 138 mEq/L (ref 135–145)

## 2021-03-06 MED ORDER — VALSARTAN 160 MG PO TABS: 160.0000 mg | ORAL_TABLET | Freq: Every day | ORAL | 3 refills | Status: DC

## 2021-03-06 NOTE — Progress Notes (Signed)
   Subjective:    Patient ID: Sarah Faulkner, female    DOB: 1980-12-07, 40 y.o.   MRN: [id number]  HPI HTN- was started on Valsartan at last visit.  BP remains elevated today.  Pt reports only sleeping 1 hr last night.  No CP, SOB, HAs, visual changes, edema.  Pt uses home wrist cuff but readings are not consistent, 'it bounces all over the place'.  Has [place] on Thursday- 'i'm very very nervous'.  Pt is currently taking 3 Norethindrone daily.  Obesity- pt is down 10 lbs since last visit.  Pt started walking 'a couple of times a week'.   Review of Systems For ROS see HPI   This visit occurred during the SARS-CoV-2 public health emergency.  Safety protocols were in place, including screening questions prior to the visit, additional usage of staff PPE, and extensive cleaning of exam room while observing appropriate contact time as indicated for disinfecting solutions.      Objective:   Physical Exam Vitals reviewed.  Constitutional:      General: She is not in acute distress.    Appearance: Normal appearance. She is well-developed. She is obese.  HENT:     Head: Normocephalic and atraumatic.  Eyes:     Conjunctiva/sclera: Conjunctivae normal.     Pupils: Pupils are equal, round, and reactive to light.  Neck:     Thyroid: No thyromegaly.  Cardiovascular:     Rate and Rhythm: Normal rate and regular rhythm.     Pulses: Normal pulses.     Heart sounds: Normal heart sounds. No murmur heard. Pulmonary:     Effort: Pulmonary effort is normal. No respiratory distress.     Breath sounds: Normal breath sounds.  Abdominal:     General: There is no distension.     Palpations: Abdomen is soft.     Tenderness: There is no abdominal tenderness.  Musculoskeletal:     Cervical back: Normal range of motion and neck supple.     Right lower leg: No edema.     Left lower leg: No edema.  Lymphadenopathy:     Cervical: No cervical adenopathy.  Skin:    General: Skin is warm and dry.   Neurological:     General: No focal deficit present.     Mental Status: She is alert and oriented to person, place, and time.  Psychiatric:        Mood and Affect: Mood normal.        Behavior: Behavior normal.        Thought Content: Thought content normal.          Assessment & Plan:

## 2021-03-06 NOTE — Patient Instructions (Signed)
Follow up in 1 month to recheck BP We'll notify you of your lab results and make any changes if needed INCREASE the Valsartan to 160mg - 2 of what you have at home, and 1 of the new prescription Continue to walk regularly- you are doing great!!! Hopefully after Thursday, you will feel better, BP will look better, and things will sort themselves out Call with any questions or concerns GOOD LUCK!!!

## 2021-03-06 NOTE — Assessment & Plan Note (Signed)
Ongoing issue.  DBP is better but SBP is unchanged.  Will increase Valsartan to 160mg  and continue to monitor closely.  She is hoping that after her [place] on Thursday she will be able to come off her OCPs which will likely lower BP.

## 2021-03-06 NOTE — Assessment & Plan Note (Signed)
Pt is down 10 lbs since her last visit.  Applauded her efforts.  Will continue to follow.

## 2021-03-07 ENCOUNTER — Other Ambulatory Visit (HOSPITAL_COMMUNITY)
Admission: RE | Admit: 2021-03-07 | Discharge: 2021-03-07 | Disposition: A | Discharge status: Home / Self Care | Payer: BC Managed Care – PPO | Source: Ambulatory Visit | Attending: Interventional Radiology | Admitting: Interventional Radiology

## 2021-03-07 DIAGNOSIS — Z01812 Encounter for preprocedural laboratory examination: Secondary | ICD-10-CM | POA: Diagnosis not present

## 2021-03-07 DIAGNOSIS — Z20822 Contact with and (suspected) exposure to covid-19: Secondary | ICD-10-CM | POA: Insufficient documentation

## 2021-03-07 LAB — SARS CORONAVIRUS 2 (TAT 6-24 HRS): SARS Coronavirus 2: NEGATIVE

## 2021-03-09 ENCOUNTER — Observation Stay (HOSPITAL_COMMUNITY)
Admission: RE | Admit: 2021-03-09 | Discharge: 2021-03-10 | Disposition: A | Discharge status: Home / Self Care | Payer: BC Managed Care – PPO | Source: Ambulatory Visit | Attending: Interventional Radiology | Admitting: Interventional Radiology

## 2021-03-09 ENCOUNTER — Encounter (HOSPITAL_COMMUNITY): Payer: Self-pay

## 2021-03-09 ENCOUNTER — Ambulatory Visit (HOSPITAL_COMMUNITY)
Admission: RE | Admit: 2021-03-09 | Discharge: 2021-03-09 | Disposition: A | Discharge status: Home / Self Care | Payer: BC Managed Care – PPO | Source: Ambulatory Visit | Attending: Interventional Radiology | Admitting: Interventional Radiology

## 2021-03-09 ENCOUNTER — Other Ambulatory Visit: Payer: Self-pay

## 2021-03-09 VITALS — BP 174/100 | HR 85 | Temp 98.0°F | Resp 18

## 2021-03-09 DIAGNOSIS — Z803 Family history of malignant neoplasm of breast: Secondary | ICD-10-CM | POA: Diagnosis not present

## 2021-03-09 DIAGNOSIS — Z8619 Personal history of other infectious and parasitic diseases: Secondary | ICD-10-CM | POA: Insufficient documentation

## 2021-03-09 DIAGNOSIS — Z9049 Acquired absence of other specified parts of digestive tract: Secondary | ICD-10-CM | POA: Diagnosis not present

## 2021-03-09 DIAGNOSIS — E119 Type 2 diabetes mellitus without complications: Secondary | ICD-10-CM | POA: Insufficient documentation

## 2021-03-09 DIAGNOSIS — I10 Essential (primary) hypertension: Secondary | ICD-10-CM | POA: Insufficient documentation

## 2021-03-09 DIAGNOSIS — Z8249 Family history of ischemic heart disease and other diseases of the circulatory system: Secondary | ICD-10-CM | POA: Diagnosis not present

## 2021-03-09 DIAGNOSIS — Z7984 Long term (current) use of oral hypoglycemic drugs: Secondary | ICD-10-CM | POA: Diagnosis not present

## 2021-03-09 DIAGNOSIS — D219 Benign neoplasm of connective and other soft tissue, unspecified: Secondary | ICD-10-CM | POA: Diagnosis present

## 2021-03-09 DIAGNOSIS — Z793 Long term (current) use of hormonal contraceptives: Secondary | ICD-10-CM | POA: Diagnosis not present

## 2021-03-09 DIAGNOSIS — D259 Leiomyoma of uterus, unspecified: Principal | ICD-10-CM | POA: Insufficient documentation

## 2021-03-09 DIAGNOSIS — Z79899 Other long term (current) drug therapy: Secondary | ICD-10-CM | POA: Insufficient documentation

## 2021-03-09 DIAGNOSIS — N939 Abnormal uterine and vaginal bleeding, unspecified: Secondary | ICD-10-CM | POA: Diagnosis not present

## 2021-03-09 HISTORY — PX: IR US GUIDE VASC ACCESS RIGHT: IMG2390

## 2021-03-09 HISTORY — PX: IR EMBO TUMOR ORGAN ISCHEMIA INFARCT INC GUIDE ROADMAPPING: IMG5449

## 2021-03-09 HISTORY — PX: IR ANGIOGRAM PELVIS SELECTIVE OR SUPRASELECTIVE: IMG661

## 2021-03-09 HISTORY — PX: IR ANGIOGRAM SELECTIVE EACH ADDITIONAL VESSEL: IMG667

## 2021-03-09 HISTORY — PX: IR US GUIDE VASC ACCESS LEFT: IMG2389

## 2021-03-09 LAB — PROTIME-INR
INR: 1 (ref 0.8–1.2)
Prothrombin Time: 13 seconds (ref 11.4–15.2)

## 2021-03-09 LAB — BASIC METABOLIC PANEL
Anion gap: 11 (ref 5–15)
BUN: 13 mg/dL (ref 6–20)
CO2: 19 mmol/L — ABNORMAL LOW (ref 22–32)
Calcium: 9.4 mg/dL (ref 8.9–10.3)
Chloride: 108 mmol/L (ref 98–111)
Creatinine, Ser: 1.14 mg/dL — ABNORMAL HIGH (ref 0.44–1.00)
GFR, Estimated: >60 mL/min (ref >60)
Glucose, Bld: 115 mg/dL — ABNORMAL HIGH (ref 70–99)
Potassium: 5.3 mmol/L — ABNORMAL HIGH (ref 3.5–5.1)
Sodium: 138 mmol/L (ref 135–145)

## 2021-03-09 LAB — CBC
HCT: 33.2 % — ABNORMAL LOW (ref 36.0–46.0)
Hemoglobin: 9.8 g/dL — ABNORMAL LOW (ref 12.0–15.0)
MCH: 22.2 pg — ABNORMAL LOW (ref 26.0–34.0)
MCHC: 29.5 g/dL — ABNORMAL LOW (ref 30.0–36.0)
MCV: 75.1 fL — ABNORMAL LOW (ref 80.0–100.0)
Platelets: 635 10*3/uL — ABNORMAL HIGH (ref 150–400)
RBC: 4.42 MIL/uL (ref 3.87–5.11)
RDW: 16.8 % — ABNORMAL HIGH (ref 11.5–15.5)
WBC: 7.9 10*3/uL (ref 4.0–10.5)
nRBC: 0 % (ref 0.0–0.2)

## 2021-03-09 LAB — GLUCOSE, CAPILLARY: Glucose-Capillary: 107 mg/dL — ABNORMAL HIGH (ref 70–99)

## 2021-03-09 LAB — HCG, SERUM, QUALITATIVE: Preg, Serum: NEGATIVE

## 2021-03-09 LAB — APTT: aPTT: 25 seconds (ref 24–36)

## 2021-03-09 MED ORDER — KETOROLAC TROMETHAMINE 30 MG/ML IJ SOLN: 30.0000 mg | INTRAMUSCULAR | Status: AC

## 2021-03-09 MED ORDER — LIDOCAINE HCL 1 % IJ SOLN
INTRAMUSCULAR | Status: AC | PRN
  Administered 2021-03-09: 10 mL via INTRADERMAL

## 2021-03-09 MED ORDER — PROMETHAZINE HCL 25 MG RE SUPP
25.0000 mg | Freq: Three times a day (TID) | RECTAL | Status: DC | PRN
  Filled 2021-03-09: qty 1

## 2021-03-09 MED ORDER — MIDAZOLAM HCL 2 MG/2ML IJ SOLN
INTRAMUSCULAR | Status: AC | PRN
  Administered 2021-03-09: 1 mg via INTRAVENOUS
  Administered 2021-03-09 (×2): 2 mg via INTRAVENOUS
  Administered 2021-03-09: 1 mg via INTRAVENOUS

## 2021-03-09 MED ORDER — FENTANYL CITRATE (PF) 100 MCG/2ML IJ SOLN
INTRAMUSCULAR | Status: AC
  Filled 2021-03-09: qty 2

## 2021-03-09 MED ORDER — SODIUM CHLORIDE 0.9 % IV SOLN: 250.0000 mL | INTRAVENOUS | Status: DC | PRN

## 2021-03-09 MED ORDER — SODIUM CHLORIDE 0.9% FLUSH: 9.0000 mL | INTRAVENOUS | Status: DC | PRN

## 2021-03-09 MED ORDER — LIDOCAINE HCL 1 % IJ SOLN
INTRAMUSCULAR | Status: AC
  Filled 2021-03-09: qty 20

## 2021-03-09 MED ORDER — ONDANSETRON HCL 4 MG/2ML IJ SOLN
4.0000 mg | Freq: Four times a day (QID) | INTRAMUSCULAR | Status: DC | PRN
  Administered 2021-03-09: 4 mg via INTRAVENOUS

## 2021-03-09 MED ORDER — IOHEXOL 300 MG/ML  SOLN
50.0000 mL | Freq: Once | INTRAMUSCULAR | Status: AC | PRN
  Administered 2021-03-09: 50 mL via INTRA_ARTERIAL

## 2021-03-09 MED ORDER — HYDROMORPHONE 1 MG/ML IV SOLN
INTRAVENOUS | Status: AC
  Administered 2021-03-09: 1.2 mg via INTRAVENOUS
  Administered 2021-03-09: 30 mg via INTRAVENOUS
  Administered 2021-03-09: 0.9 mg via INTRAVENOUS
  Administered 2021-03-10: 0.3 mg via INTRAVENOUS
  Administered 2021-03-10: 3 mg via INTRAVENOUS
  Filled 2021-03-09: qty 30

## 2021-03-09 MED ORDER — MIDAZOLAM HCL 2 MG/2ML IJ SOLN
INTRAMUSCULAR | Status: AC
  Filled 2021-03-09: qty 6

## 2021-03-09 MED ORDER — DIPHENHYDRAMINE HCL 50 MG/ML IJ SOLN
12.5000 mg | Freq: Four times a day (QID) | INTRAMUSCULAR | Status: DC | PRN
  Administered 2021-03-09: 12.5 mg via INTRAVENOUS
  Filled 2021-03-09: qty 1

## 2021-03-09 MED ORDER — NORETHINDRONE ACETATE 5 MG PO TABS
15.0000 mg | ORAL_TABLET | Freq: Every day | ORAL | Status: DC
  Administered 2021-03-10: 15 mg via ORAL
  Filled 2021-03-09 (×2): qty 3

## 2021-03-09 MED ORDER — DIPHENHYDRAMINE HCL 12.5 MG/5ML PO ELIX
12.5000 mg | ORAL_SOLUTION | Freq: Four times a day (QID) | ORAL | Status: DC | PRN
  Filled 2021-03-09: qty 5

## 2021-03-09 MED ORDER — SODIUM CHLORIDE 0.9 % IV SOLN: INTRAVENOUS | Status: DC

## 2021-03-09 MED ORDER — METFORMIN HCL 500 MG PO TABS: 1000.0000 mg | ORAL_TABLET | Freq: Two times a day (BID) | ORAL | Status: DC

## 2021-03-09 MED ORDER — SODIUM CHLORIDE 0.9% FLUSH: 3.0000 mL | INTRAVENOUS | Status: DC | PRN

## 2021-03-09 MED ORDER — IRBESARTAN 150 MG PO TABS
150.0000 mg | ORAL_TABLET | Freq: Every day | ORAL | Status: DC
  Administered 2021-03-09 – 2021-03-10 (×2): 150 mg via ORAL
  Filled 2021-03-09 (×2): qty 1

## 2021-03-09 MED ORDER — IOHEXOL 300 MG/ML  SOLN
100.0000 mL | Freq: Once | INTRAMUSCULAR | Status: AC | PRN
  Administered 2021-03-09: 100 mL via INTRA_ARTERIAL

## 2021-03-09 MED ORDER — FENTANYL CITRATE (PF) 100 MCG/2ML IJ SOLN
INTRAMUSCULAR | Status: AC | PRN
  Administered 2021-03-09 (×6): 50 ug via INTRAVENOUS

## 2021-03-09 MED ORDER — CEFAZOLIN IN SODIUM CHLORIDE 3-0.9 GM/100ML-% IV SOLN
3.0000 g | Freq: Once | INTRAVENOUS | Status: AC
  Administered 2021-03-09: 3 g via INTRAVENOUS
  Filled 2021-03-09: qty 100

## 2021-03-09 MED ORDER — IOHEXOL 300 MG/ML  SOLN
50.0000 mL | Freq: Once | INTRAMUSCULAR | Status: AC | PRN
  Administered 2021-03-09: 20 mL via INTRA_ARTERIAL

## 2021-03-09 MED ORDER — DEXAMETHASONE 4 MG PO TABS
8.0000 mg | ORAL_TABLET | ORAL | Status: AC
  Administered 2021-03-09: 8 mg via ORAL
  Filled 2021-03-09: qty 2

## 2021-03-09 MED ORDER — SODIUM CHLORIDE 0.9% FLUSH
3.0000 mL | Freq: Two times a day (BID) | INTRAVENOUS | Status: DC
  Administered 2021-03-09: 3 mL via INTRAVENOUS

## 2021-03-09 MED ORDER — KETOROLAC TROMETHAMINE 30 MG/ML IJ SOLN
30.0000 mg | Freq: Four times a day (QID) | INTRAMUSCULAR | Status: DC
  Administered 2021-03-09 – 2021-03-10 (×4): 30 mg via INTRAVENOUS
  Filled 2021-03-09 (×4): qty 1

## 2021-03-09 MED ORDER — PROMETHAZINE HCL 25 MG PO TABS: 25.0000 mg | ORAL_TABLET | Freq: Three times a day (TID) | ORAL | Status: DC | PRN

## 2021-03-09 MED ORDER — METFORMIN HCL 500 MG PO TABS
1000.0000 mg | ORAL_TABLET | Freq: Two times a day (BID) | ORAL | Status: DC
  Administered 2021-03-09 – 2021-03-10 (×2): 1000 mg via ORAL
  Filled 2021-03-09 (×2): qty 2

## 2021-03-09 MED ORDER — NALOXONE HCL 0.4 MG/ML IJ SOLN: 0.4000 mg | INTRAMUSCULAR | Status: DC | PRN

## 2021-03-09 MED ORDER — KETOROLAC TROMETHAMINE 30 MG/ML IJ SOLN
INTRAMUSCULAR | Status: AC
  Administered 2021-03-09: 30 mg via INTRAVENOUS
  Filled 2021-03-09: qty 1

## 2021-03-09 MED ORDER — DOCUSATE SODIUM 100 MG PO CAPS
100.0000 mg | ORAL_CAPSULE | Freq: Two times a day (BID) | ORAL | Status: DC
  Administered 2021-03-09 – 2021-03-10 (×2): 100 mg via ORAL
  Filled 2021-03-09 (×2): qty 1

## 2021-03-09 MED ORDER — ONDANSETRON HCL 4 MG/2ML IJ SOLN
4.0000 mg | Freq: Four times a day (QID) | INTRAMUSCULAR | Status: DC | PRN
  Administered 2021-03-09: 4 mg via INTRAVENOUS
  Filled 2021-03-09: qty 2

## 2021-03-09 NOTE — Procedures (Signed)
Interventional Radiology Procedure Note  Procedure: UFE    Complications: None  Estimated Blood Loss:  MIN  Findings: SUCCESSFUL BILATERAL EMBO FULL REPORT IN PACS     M. TREVOR Laurna Shetley, MD    

## 2021-03-09 NOTE — H&P (Signed)
Reason for Consult:Uterine fibroids Consulting Radiologist: Dr. Annamaria Boots Referring Physician: Dr. Harolyn Rutherford   HPI: Sarah Faulkner is an 40 y.o. female with symptomatic uterine fibroids. She had consult with Dr. Annamaria Boots back in March and discussed uterine fibroid embolization. She has since had pelvic MRI confirming large uterine fibroids. She is a candidate for UFE procedure and is scheduled today. PMHx, meds, labs, imaging, allergies reviewed. Feels well, no recent fevers, chills, illness. Has been NPO today as directed.   Past Medical History:  Past Medical History:  Diagnosis Date   Anemia    Chicken pox    Depression    DM (diabetes mellitus), type 2 ([place])    Hypertension     Surgical History:  Past Surgical History:  Procedure Laterality Date   CHOLECYSTECTOMY  2005   IR RADIOLOGIST EVAL & MGMT  12/15/2020    Family History:  Family History  Problem Relation Age of Onset   Cancer Mother        breast   Heart disease Maternal Grandmother    Stroke Maternal Grandmother    Hypertension Maternal Grandmother    Breast cancer Neg Hx     Social History:  reports that she has never smoked. She has never used smokeless tobacco. She reports that she does not drink alcohol and does not use drugs.  Allergies: No Known Allergies  Medications: Prior to Admission:  Medications Prior to Admission  Medication Sig Dispense Refill Last Dose   metFORMIN (GLUCOPHAGE) 1000 MG tablet TAKE 1 TABLET(1000 MG) BY MOUTH TWICE DAILY WITH A MEAL 180 tablet 0 03/08/2021   norethindrone (AYGESTIN) 5 MG tablet Take 3 tablets (15 mg total) by mouth daily. 90 tablet 1 03/08/2021   UNABLE TO FIND Diabetic Supplies:  Glucometer, test strips, lancets. Check glucose BID (fasting and 2 hours after eating). One month supply. Refills PRN.   03/08/2021   valACYclovir (VALTREX) 1000 MG tablet TAKE 1 TABLET(1000 MG) BY MOUTH DAILY 30 tablet 3 Past Week   valsartan (DIOVAN) 160 MG tablet Take 1 tablet (160 mg  total) by mouth daily. 30 tablet 3 03/08/2021    ROS: See HPI for pertinent findings, otherwise complete 10 system review negative.  Physical Exam: Blood pressure (!) 165/90, pulse 87, temperature 98.4 F (36.9 C), temperature source Oral, resp. rate 19, last menstrual period 03/07/2021, SpO2 100 %. General: NAD Lungs: CTA without w/r/r Heart: RRR, no m/g/r Abdomen: soft obese, NT Ext: Femoral pulses difficult to feel due to body habitus, but pedal pulses palpable bilaterally Neuro: A&O x 4    Labs: Results for orders placed or performed during the hospital encounter of 03/09/21 (from the past 48 hour(s))  CBC     Status: Abnormal   Collection Time: 03/09/21  8:34 AM  Result Value Ref Range   WBC 7.9 4.0 - 10.5 K/uL   RBC 4.42 3.87 - 5.11 MIL/uL   Hemoglobin 9.8 (L) 12.0 - 15.0 g/dL   HCT 33.2 (L) 36.0 - 46.0 %   MCV 75.1 (L) 80.0 - 100.0 fL   MCH 22.2 (L) 26.0 - 34.0 pg   MCHC 29.5 (L) 30.0 - 36.0 g/dL   RDW 16.8 (H) 11.5 - 15.5 %   Platelets 635 (H) 150 - 400 K/uL   nRBC 0.0 0.0 - 0.2 %    Comment: Performed at [facility], [place] [address], [place], [place] [address]  Basic metabolic panel     Status: Abnormal   Collection Time: 03/09/21  8:34  AM  Result Value Ref Range   Sodium 138 135 - 145 mmol/L   Potassium 5.3 (H) 3.5 - 5.1 mmol/L   Chloride 108 98 - 111 mmol/L   CO2 19 (L) 22 - 32 mmol/L   Glucose, Bld 115 (H) 70 - 99 mg/dL    Comment: Glucose reference range applies only to samples taken after fasting for at least 8 hours.   BUN 13 6 - 20 mg/dL   Creatinine, Ser 1.14 (H) 0.44 - 1.00 mg/dL   Calcium 9.4 8.9 - 10.3 mg/dL   GFR, Estimated >60 >60 mL/min    Comment: (NOTE) Calculated using the CKD-EPI Creatinine Equation (2021)    Anion gap 11 5 - 15    Comment: Performed at [facility], [place] [address], [place], [place] [address]  hCG, serum, qualitative     Status: None   Collection Time: 03/09/21  8:34 AM  Result Value  Ref Range   Preg, Serum NEGATIVE NEGATIVE    Comment:        THE SENSITIVITY OF THIS METHODOLOGY IS >10 mIU/mL. Performed at [facility], [place] [address], [place], [place] [address]   Glucose, capillary     Status: Abnormal   Collection Time: 03/09/21  8:41 AM  Result Value Ref Range   Glucose-Capillary 107 (H) 70 - 99 mg/dL    Comment: Glucose reference range applies only to samples taken after fasting for at least 8 hours.    PT/INR No results for input(s): LABPROT, INR in the last 72 hours.     No results found.  Assessment/Plan: Symptomatic uterine fibroids Plan for bilateral uterine artery embolization. Labs reviewed. Risks and benefits discussed with the patient including, but not limited to bleeding, infection, vascular injury, post procedural pain, nausea, vomiting and fatigue, contrast induced renal failure, and possible need for additional procedures. All of the patient's questions were answered, patient is agreeable to proceed. Consent signed and in chart. Plan for overnight admission PCA for perioperative pain control  Ascencion Dike PA-C 03/09/2021, 9:34 AM

## 2021-03-10 ENCOUNTER — Other Ambulatory Visit (HOSPITAL_COMMUNITY): Payer: Self-pay | Admitting: Physician Assistant

## 2021-03-10 ENCOUNTER — Other Ambulatory Visit (HOSPITAL_COMMUNITY): Payer: Self-pay | Admitting: Student

## 2021-03-10 DIAGNOSIS — Z79899 Other long term (current) drug therapy: Secondary | ICD-10-CM | POA: Diagnosis not present

## 2021-03-10 DIAGNOSIS — Z7984 Long term (current) use of oral hypoglycemic drugs: Secondary | ICD-10-CM | POA: Diagnosis not present

## 2021-03-10 DIAGNOSIS — Z8249 Family history of ischemic heart disease and other diseases of the circulatory system: Secondary | ICD-10-CM | POA: Diagnosis not present

## 2021-03-10 DIAGNOSIS — D259 Leiomyoma of uterus, unspecified: Secondary | ICD-10-CM | POA: Diagnosis not present

## 2021-03-10 DIAGNOSIS — Z8619 Personal history of other infectious and parasitic diseases: Secondary | ICD-10-CM | POA: Diagnosis not present

## 2021-03-10 DIAGNOSIS — Z793 Long term (current) use of hormonal contraceptives: Secondary | ICD-10-CM | POA: Diagnosis not present

## 2021-03-10 DIAGNOSIS — E119 Type 2 diabetes mellitus without complications: Secondary | ICD-10-CM | POA: Diagnosis not present

## 2021-03-10 DIAGNOSIS — Z803 Family history of malignant neoplasm of breast: Secondary | ICD-10-CM | POA: Diagnosis not present

## 2021-03-10 DIAGNOSIS — I10 Essential (primary) hypertension: Secondary | ICD-10-CM | POA: Diagnosis not present

## 2021-03-10 DIAGNOSIS — Z9889 Other specified postprocedural states: Secondary | ICD-10-CM | POA: Diagnosis not present

## 2021-03-10 DIAGNOSIS — Z9049 Acquired absence of other specified parts of digestive tract: Secondary | ICD-10-CM | POA: Diagnosis not present

## 2021-03-10 LAB — GLUCOSE, CAPILLARY: Glucose-Capillary: 132 mg/dL — ABNORMAL HIGH (ref 70–99)

## 2021-03-10 MED ORDER — IBUPROFEN 400 MG PO TABS
600.0000 mg | ORAL_TABLET | Freq: Four times a day (QID) | ORAL | Status: DC
  Administered 2021-03-10: 600 mg via ORAL
  Filled 2021-03-10: qty 1

## 2021-03-10 MED ORDER — HYDROCODONE-ACETAMINOPHEN 5-325 MG PO TABS: 1.0000 | ORAL_TABLET | Freq: Four times a day (QID) | ORAL | Status: DC | PRN

## 2021-03-10 MED ORDER — HYDROCODONE-ACETAMINOPHEN 5-325 MG PO TABS: 1.0000 | ORAL_TABLET | Freq: Four times a day (QID) | ORAL | 0 refills | Status: DC | PRN

## 2021-03-10 MED ORDER — DOCUSATE SODIUM 100 MG PO CAPS: 100.0000 mg | ORAL_CAPSULE | Freq: Two times a day (BID) | ORAL | 0 refills | Status: AC | PRN

## 2021-03-10 MED ORDER — IBUPROFEN 600 MG PO TABS: 600.0000 mg | ORAL_TABLET | Freq: Four times a day (QID) | ORAL | 0 refills | Status: DC

## 2021-03-10 MED ORDER — HYDROCODONE-ACETAMINOPHEN 5-325 MG PO TABS: 1.0000 | ORAL_TABLET | Freq: Four times a day (QID) | ORAL | 0 refills | Status: AC | PRN

## 2021-03-10 MED ORDER — ONDANSETRON HCL 8 MG PO TABS: 8.0000 mg | ORAL_TABLET | Freq: Three times a day (TID) | ORAL | 0 refills | Status: AC | PRN

## 2021-03-10 NOTE — Discharge Summary (Signed)
Patient ID: Sarah Faulkner MRN: [id number] DOB/AGE: 40-22-1982 40 y.o.  Admit date: 03/09/2021 Discharge date: 03/10/2021  Supervising Physician: Jacqulynn Cadet  Patient Status: [facility] - In-pt  Admission Diagnoses: symptomatic uterine fibroids   Discharge Diagnoses:  Active Problems:   Fibroids   Discharged Condition: good  Hospital Course:   Patient presented to [facility] IR for an image-guided bilateral uterine artery embolization with Dr. Annamaria Boots on 03/09/21.  Procedure occurred without major complications and patient was transferred to floor in stable condition (VSS, bilateral groin puncture sites stable) for overnight observation. No major events occurred overnight.   Patient awake and alert  no complaints at this time. Foley catheter removed without difficulty. Patient denies severe abdominal pain. Reports mild hematuria this morning, no clots noted. Bilateral groin puncture sites stable.   VS revealed HTN with max SBP of 205. Patient denied symptoms of hypertensive emergency, no headache, blurry vision,  CP, SOB. Home [place] med give at 1024 hrs, SBP dropped to 174. Patient states that she has been working with her PCP about her HTN. Encouraged patient to follow up with her PCP.  Plan to discharge home today and follow-up with Dr. Annamaria Boots for televisit 3-4 weeks after discharge.   Consults: None  Significant Diagnostic Studies: none   Treatments: Bilateral Uterine artery embolization   Discharge Exam: Blood pressure (!) 174/100, pulse 85, temperature 98 F (36.7 C), temperature source Oral, resp. rate 18, last menstrual period 03/07/2021, SpO2 100 %.  Physical Exam  Vitals and nursing note reviewed.  Constitutional:      General: He is not in acute distress.    Appearance: Normal appearance.  HENT:     Head: Normocephalic and atraumatic.     Mouth/Throat:     Mouth: Mucous membranes are moist.     Pharynx: Oropharynx is clear.  Cardiovascular:     Rate and Rhythm:  Normal rate and regular rhythm.     Pulses: Normal pulses.     Heart sounds: Normal heart sounds.     DP 2+ bilaterally Pulmonary:     Effort: Pulmonary effort is normal.     Breath sounds: Normal breath sounds. No wheezing, rhonchi or rales.  Abdominal:     General: Bowel sounds are normal. There is no distension.     Palpations: Abdomen is soft.  Skin:    General: Skin is warm and dry.  Positive dressing on bilateral femoral artery puncture sites. Site are unremarkable with no erythema, edema, tenderness, bleeding or drainage. Minimal amount of old, dry blood note son the dressing. Dressing otherwise clean, dry, and intact.  Neurological:     Mental Status: He is alert and oriented to person, place, and time.  Psychiatric:        Mood and Affect: Mood normal.        Behavior: Behavior normal.    Disposition: Discharge disposition: 01-Home or Self Care      Discharge Instructions     Call MD for:   Complete by: As directed    Call MD for:  difficulty breathing, headache or visual disturbances   Complete by: As directed    Call MD for:  extreme fatigue   Complete by: As directed    Call MD for:  hives   Complete by: As directed    Call MD for:  persistant dizziness or light-headedness   Complete by: As directed    Call MD for:  persistant nausea and vomiting   Complete by: As directed  Call MD for:  redness, tenderness, or signs of infection (pain, swelling, redness, odor or green/yellow discharge around incision site)   Complete by: As directed    Call MD for:  severe uncontrolled pain   Complete by: As directed    Call MD for:  temperature >100.4   Complete by: As directed    Diet - low sodium heart healthy   Complete by: As directed    Discharge wound care:   Complete by: As directed    Keep the site clean and dry. No further dressing needed. No submerging (bathing or swimming) for 7 days.   Increase activity slowly   Complete by: As directed        Allergies as of 03/10/2021   No Known Allergies      Medication List     TAKE these medications    cholecalciferol 25 MCG (1000 UNIT) tablet Commonly known as: VITAMIN D3 Take 1,000 Units by mouth daily.   docusate sodium 100 MG capsule Commonly known as: COLACE Take 1 capsule (100 mg total) by mouth 2 (two) times daily as needed for up to 7 days for mild constipation.   ferrous sulfate 325 (65 FE) MG EC tablet Take 325 mg by mouth daily.   HYDROcodone-acetaminophen 5-325 MG tablet Commonly known as: NORCO/VICODIN Take 1 tablet by mouth every 6 (six) hours as needed for up to 7 days for severe pain or moderate pain.   ibuprofen 600 MG tablet Commonly known as: ADVIL Take 1 tablet (600 mg total) by mouth every 6 (six) hours.   norethindrone 5 MG tablet Commonly known as: AYGESTIN Take 3 tablets (15 mg total) by mouth daily.   ondansetron 8 MG tablet Commonly known as: Zofran Take 1 tablet (8 mg total) by mouth every 8 (eight) hours as needed for up to 7 days for nausea or vomiting.   UNABLE TO FIND Diabetic Supplies:  Glucometer, test strips, lancets. Check glucose BID (fasting and 2 hours after eating). One month supply. Refills PRN.   valsartan 160 MG tablet Commonly known as: DIOVAN Take 1 tablet (160 mg total) by mouth daily.   vitamin C 1000 MG tablet Take 1,000 mg by mouth daily.   zinc gluconate 50 MG tablet Take 50 mg by mouth daily.       ASK your doctor about these medications    metFORMIN 1000 MG tablet Commonly known as: GLUCOPHAGE TAKE 1 TABLET(1000 MG) BY MOUTH TWICE DAILY WITH A MEAL   valACYclovir 1000 MG tablet Commonly known as: VALTREX TAKE 1 TABLET(1000 MG) BY MOUTH DAILY               Discharge Care Instructions  (From admission, onward)           Start     Ordered   03/10/21 0000  Discharge wound care:       Comments: Keep the site clean and dry. No further dressing needed. No submerging (bathing or swimming) for  7 days.   03/10/21 1138            Follow-up Information     Greggory Keen, MD Follow up.   Specialties: Interventional Radiology, Radiology Why: Televisit in 4 weeks. Our office will call you to set up the appointment. Contact information: [place] [address] [phone]                  Electronically Signed: Tera Mater, PA-C 03/10/2021, 11:59 AM  I have spent Less Than 30 Minutes discharging Sarah Faulkner.

## 2021-03-10 NOTE — Discharge Instructions (Addendum)
Uterine Artery Embolization, Care After  This sheet gives you information about how to care for yourself after your procedure. Your health care provider may also give you more specific instructions. If you have problems or questions, contact your health care provider.  What can I expect after the procedure? After your procedure, it is common to have pain and discomfort in the abdomen. You will be given prescription pain medicines to control this if the pain is severe.  Take Ibuprofen every 6 hours Take opioids as needed for severe pain Take Zofran up to three times a day as needed/ if become nauseous after taking opioids Take Colace up to two times a day as needed/ if become constipated after taking opioids  Follow these instructions at home: Puncture site care Follow instructions from your health care provider about how to take care of your incision. Make sure you:  - Ok to shower 24 hours following procedure. No further dressing changes needed - ensure area remains clean and dry until fully healed.  - No submerging (swimming, bathing) for 7 days post-procedure. Check your puncture site area every day for signs of infection. Check for: - Redness, swelling, or pain. - Fluid or blood. - Warmth. - Pus or a bad smell. Activity Rest as often as needing during the first few days of recovery.  General instructions To prevent or treat constipation while you are taking prescription pain medicine, your health care provider may recommend that you: - Drink enough fluid to keep your urine clear or pale yellow. - Take over-the-counter or prescription medicines. - Eat foods that are high in fiber, such as fresh fruits and vegetables, whole grains, and beans. - Limit foods that are high in fat and processed sugars, such as fried and sweet foods. - Do not use any products that contain nicotine or tobacco, such as cigarettes and e-cigarettes. If you need help quitting, ask your health care provider. -  Keep all follow-up visits as told by your health care provider. This is important.  3-4 week televisit with the doctor who performed the procedure. Our office will call you to set up the appointment.   Contact a health care provider if: You cannot pass gas. You are unable to have a bowel movement within 3 days. You have a skin rash.  Get help right away if: You have a fever. You have severe or lasting pain in your abdomen, shoulder, or back. You have trouble swallowing or breathing. You have severe weakness or dizziness. You have chest pain or shortness of breath.   This information is not intended to replace advice given to you by your health care provider. Make sure you discuss any questions you have with your health care provider.  

## 2021-03-10 NOTE — Progress Notes (Signed)
Wasted 74ml of Dilaudid in steri-cycle. Silver [place] witness the waste.

## 2021-03-10 NOTE — Progress Notes (Signed)
D/C instructions given to patient. Patient had no questions. NT or writer will wheel patient out once she is dressed

## 2021-03-14 ENCOUNTER — Encounter: Payer: Self-pay | Admitting: Family Medicine

## 2021-03-22 ENCOUNTER — Encounter: Payer: Self-pay | Admitting: *Deleted

## 2021-03-28 ENCOUNTER — Encounter: Payer: Self-pay | Admitting: *Deleted

## 2021-04-06 ENCOUNTER — Other Ambulatory Visit: Payer: Self-pay

## 2021-04-06 ENCOUNTER — Ambulatory Visit
Admission: RE | Admit: 2021-04-06 | Discharge: 2021-04-06 | Disposition: A | Discharge status: Home / Self Care | Payer: BC Managed Care – PPO | Source: Ambulatory Visit | Attending: Student | Admitting: Student

## 2021-04-06 ENCOUNTER — Encounter: Payer: Self-pay | Admitting: *Deleted

## 2021-04-06 DIAGNOSIS — Z9889 Other specified postprocedural states: Secondary | ICD-10-CM | POA: Diagnosis not present

## 2021-04-06 DIAGNOSIS — N939 Abnormal uterine and vaginal bleeding, unspecified: Secondary | ICD-10-CM | POA: Diagnosis not present

## 2021-04-06 DIAGNOSIS — D259 Leiomyoma of uterus, unspecified: Secondary | ICD-10-CM | POA: Diagnosis not present

## 2021-04-06 DIAGNOSIS — D219 Benign neoplasm of connective and other soft tissue, unspecified: Secondary | ICD-10-CM

## 2021-04-06 HISTORY — PX: IR RADIOLOGIST EVAL & MGMT: IMG5224

## 2021-04-06 NOTE — Progress Notes (Signed)
Patient ID: Sarah Faulkner, female   DOB: [birth year], 40 y.o.   MRN: [id number]       Chief Complaint:  Uterine fibroids, dysfunctional uterine bleeding  Referring Physician(s): Dr Harolyn Rutherford  History of Present Illness: Sarah Faulkner is a 40 y.o. female with diabetes, obesity, and symptomatic uterine fibroids with abnormal uterine bleeding.  She underwent successful uterine fibroid embolization 03/09/2021 at [facility].  She had an uneventful overnight observation recovery at [facility] was discharged following day.  Overall she is doing very well.  She states it took her about 10 days to recover at home from the procedure but is feeling nearly back to normal.  She describes a short menstrual cycle after the procedure for 5 to 7 days.  She now has occasional spotting which is improving.  No significant abdominal pelvic pain.  No abnormal discharge.  No recent illness or fevers.  She is back to work.  Past Medical History:  Diagnosis Date   Anemia    Chicken pox    Depression    DM (diabetes mellitus), type 2 ([place])    Hypertension     Past Surgical History:  Procedure Laterality Date   CHOLECYSTECTOMY  2005   IR ANGIOGRAM PELVIS SELECTIVE OR SUPRASELECTIVE  03/09/2021   IR ANGIOGRAM SELECTIVE EACH ADDITIONAL VESSEL  03/09/2021   IR ANGIOGRAM SELECTIVE EACH ADDITIONAL VESSEL  03/09/2021   IR EMBO TUMOR ORGAN ISCHEMIA INFARCT INC GUIDE ROADMAPPING  03/09/2021   IR RADIOLOGIST EVAL & MGMT  12/15/2020   IR US GUIDE VASC ACCESS LEFT  03/09/2021   IR US GUIDE VASC ACCESS RIGHT  03/09/2021    Allergies: Patient has no known allergies.  Medications: Prior to Admission medications   Medication Sig Start Date End Date Taking? Authorizing Provider  Ascorbic Acid (VITAMIN C) 1000 MG tablet Take 1,000 mg by mouth daily.    [provider]  cholecalciferol (VITAMIN D3) 25 MCG (1000 UNIT) tablet Take 1,000 Units by mouth daily.    [provider]  ferrous  sulfate 325 (65 FE) MG EC tablet Take 325 mg by mouth daily.    [provider]  ibuprofen (ADVIL) 600 MG tablet Take 1 tablet (600 mg total) by mouth every 6 (six) hours. 03/10/21   Han, Aimee H, PA-C  metFORMIN (GLUCOPHAGE) 1000 MG tablet TAKE 1 TABLET(1000 MG) BY MOUTH TWICE DAILY WITH A MEAL Patient taking differently: Take 1,000 mg by mouth 2 (two) times daily with a meal. 03/01/21   Midge Minium, MD  norethindrone (AYGESTIN) 5 MG tablet Take 3 tablets (15 mg total) by mouth daily. 02/01/21   Anyanwu, Sallyanne Havers, MD  UNABLE TO FIND Diabetic Supplies:  Glucometer, test strips, lancets. Check glucose BID (fasting and 2 hours after eating). One month supply. Refills PRN. 07/17/11   [provider]  valACYclovir (VALTREX) 1000 MG tablet TAKE 1 TABLET(1000 MG) BY MOUTH DAILY Patient not taking: No sig reported 02/02/21   Midge Minium, MD  valsartan (DIOVAN) 160 MG tablet Take 1 tablet (160 mg total) by mouth daily. 03/06/21   Midge Minium, MD  zinc gluconate 50 MG tablet Take 50 mg by mouth daily.    [provider]     Family History  Problem Relation Age of Onset   Cancer Mother        breast   Heart disease Maternal Grandmother    Stroke Maternal Grandmother    Hypertension Maternal Grandmother    Breast  cancer Neg Hx     Social History   Socioeconomic History   Marital status: Single    Spouse name: Not on file   Number of children: Not on file   Years of education: Not on file   Highest education level: Associate degree: academic program  Occupational History   Not on file  Tobacco Use   Smoking status: Never   Smokeless tobacco: Never  Vaping Use   Vaping Use: Never used  Substance and Sexual Activity   Alcohol use: No   Drug use: No   Sexual activity: Not Currently    Birth control/protection: None  Other Topics Concern   Not on file  Social History Narrative   Not on file   Social Determinants of Health   Financial  Resource Strain: Not on file  Food Insecurity: No Food Insecurity   Worried About Running Out of Food in the Last Year: Never true   [place] in the Last Year: Never true  Transportation Needs: No Transportation Needs   Lack of Transportation (Medical): No   Lack of Transportation (Non-Medical): No  Physical Activity: Not on file  Stress: Not on file  Social Connections: Not on file     Review of Systems  Review of Systems: A 12 point ROS discussed and pertinent positives are indicated in the HPI above.  All other systems are negative.  Physical Exam No direct physical exam was performed telephone health visit only today because of COVID pandemic Vital Signs: LMP 03/07/2021   Imaging: IR Angiogram Pelvis Selective Or Supraselective  Result Date: 03/09/2021 INDICATION: SYMPTOMATIC UTERINE FIBROIDS, ABNORMAL MENSTRUAL BLEEDING, PELVIC PAIN EXAM: UTERINE FIBROID EMBOLIZATION Date:  03/09/2021 03/09/2021 11:28 am Radiologist:  M. Daryll Brod, MD Guidance:  ULTRASOUND AND FLUOROSCOPIC MEDICATIONS: Ancef 3 gm IV. The antibiotic was administered within 1 hour of the procedure ANESTHESIA/SEDATION: Fentanyl 250 mcg IV; Versed 6.0 mg IV Moderate Sedation Time:  59 MINUTES The patient was continuously monitored during the procedure by the interventional radiology nurse under my direct supervision. CONTRAST:  34mL OMNIPAQUE IOHEXOL 300 MG/ML SOLN, 1mL OMNIPAQUE IOHEXOL 300 MG/ML SOLN, 144mL OMNIPAQUE IOHEXOL 300 MG/ML SOLN FLUOROSCOPY TIME:  Fluoroscopy Time: 13 minutes 48 seconds (2,472 mGy). COMPLICATIONS: None immediate. PROCEDURE: Informed consent was obtained from the patient following explanation of the procedure, risks, benefits and alternatives. The patient understands, agrees and consents for the procedure. All questions were addressed. A time out was performed. Maximal barrier sterile technique utilized including caps, mask, sterile gowns, sterile gloves, large sterile drape, hand  hygiene, and betadine prep. Under sterile conditions and local anesthesia, ultrasound bilateral common femoral artery micropuncture access performed. Images obtained for documentation of the patent common femoral arteries bilaterally. Over Bentson guidewires, bilateral 5 French sheaths inserted. C2 catheters were advanced bilaterally. Initially the left uterine access was performed. The 5 French C2 catheter was advanced into the left common iliac artery. Initial left common iliac angiogram confirms patency of the left common, internal and external iliac arteries. Over the Bentson guidewire, the 5 [place] C2 catheter was advanced into the internal iliac system. Contrast injection confirms position of the 5 French C2 catheter within the enlarged tortuous left uterine artery. Through the access, the microcatheter was advanced more peripherally into the left uterine artery. Selective left uterine angiogram confirms position of the catheter access and safe for embolization. Next, the right uterine access was performed. In a similar fashion, a C2 catheter was advanced into the right internal iliac artery.  Initial right internal iliac angiogram performed. The anterior and posterior divisions are visualized. The tortuous dilated large right in artery was localized. Renegade high flow microcatheter was advanced over a single angled GT Glidewire to the right uterine artery. Selective right uterine angiogram performed. This confirms peripheral position within the right uterine artery and safe for embolization. Next, simultaneous uterine artery injections were performed for complete uterine angiography. Again, this confirms adequate bilateral uterine artery access for embolization. Left uterine artery embolization performed with 2 vials of 500-700 micron embospheres and 4 vials of 700-900 micron embospheres. Right uterine artery embolization performed with 2 vials of 500-700 micron embospheres and 2 vials of 700-900 micron  embospheres. Post embolization uterine angiograms confirm stasis within the uterine vasculature bilaterally. Bilateral catheter access removed. Injection of the common femoral artery sheath bilaterally confirms access adequate for ExoSeal. Hemostasis obtained with the ExoSeal bilaterally. The patient tolerated the procedure well. No immediate complication. IMPRESSION: Successful bilateral uterine artery embolization (U F E) Electronically Signed   By: Jerilynn Mages.  Hoby Kawai M.D.   On: 03/09/2021 11:46   IR Angiogram Selective Each Additional Vessel  Result Date: 03/09/2021 INDICATION: SYMPTOMATIC UTERINE FIBROIDS, ABNORMAL MENSTRUAL BLEEDING, PELVIC PAIN EXAM: UTERINE FIBROID EMBOLIZATION Date:  03/09/2021 03/09/2021 11:28 am Radiologist:  M. Daryll Brod, MD Guidance:  ULTRASOUND AND FLUOROSCOPIC MEDICATIONS: Ancef 3 gm IV. The antibiotic was administered within 1 hour of the procedure ANESTHESIA/SEDATION: Fentanyl 250 mcg IV; Versed 6.0 mg IV Moderate Sedation Time:  55 MINUTES The patient was continuously monitored during the procedure by the interventional radiology nurse under my direct supervision. CONTRAST:  58mL OMNIPAQUE IOHEXOL 300 MG/ML SOLN, 55mL OMNIPAQUE IOHEXOL 300 MG/ML SOLN, 169mL OMNIPAQUE IOHEXOL 300 MG/ML SOLN FLUOROSCOPY TIME:  Fluoroscopy Time: 13 minutes 48 seconds (2,472 mGy). COMPLICATIONS: None immediate. PROCEDURE: Informed consent was obtained from the patient following explanation of the procedure, risks, benefits and alternatives. The patient understands, agrees and consents for the procedure. All questions were addressed. A time out was performed. Maximal barrier sterile technique utilized including caps, mask, sterile gowns, sterile gloves, large sterile drape, hand hygiene, and betadine prep. Under sterile conditions and local anesthesia, ultrasound bilateral common femoral artery micropuncture access performed. Images obtained for documentation of the patent common femoral arteries  bilaterally. Over Bentson guidewires, bilateral 5 French sheaths inserted. C2 catheters were advanced bilaterally. Initially the left uterine access was performed. The 5 French C2 catheter was advanced into the left common iliac artery. Initial left common iliac angiogram confirms patency of the left common, internal and external iliac arteries. Over the Bentson guidewire, the 5 [place] C2 catheter was advanced into the internal iliac system. Contrast injection confirms position of the 5 French C2 catheter within the enlarged tortuous left uterine artery. Through the access, the microcatheter was advanced more peripherally into the left uterine artery. Selective left uterine angiogram confirms position of the catheter access and safe for embolization. Next, the right uterine access was performed. In a similar fashion, a C2 catheter was advanced into the right internal iliac artery. Initial right internal iliac angiogram performed. The anterior and posterior divisions are visualized. The tortuous dilated large right in artery was localized. Renegade high flow microcatheter was advanced over a single angled GT Glidewire to the right uterine artery. Selective right uterine angiogram performed. This confirms peripheral position within the right uterine artery and safe for embolization. Next, simultaneous uterine artery injections were performed for complete uterine angiography. Again, this confirms adequate bilateral uterine artery access for embolization. Left uterine artery  embolization performed with 2 vials of 500-700 micron embospheres and 4 vials of 700-900 micron embospheres. Right uterine artery embolization performed with 2 vials of 500-700 micron embospheres and 2 vials of 700-900 micron embospheres. Post embolization uterine angiograms confirm stasis within the uterine vasculature bilaterally. Bilateral catheter access removed. Injection of the common femoral artery sheath bilaterally confirms access adequate  for ExoSeal. Hemostasis obtained with the ExoSeal bilaterally. The patient tolerated the procedure well. No immediate complication. IMPRESSION: Successful bilateral uterine artery embolization (U F E) Electronically Signed   By: Jerilynn Mages.  Lya Holben M.D.   On: 03/09/2021 11:46   IR Angiogram Selective Each Additional Vessel  Result Date: 03/09/2021 INDICATION: SYMPTOMATIC UTERINE FIBROIDS, ABNORMAL MENSTRUAL BLEEDING, PELVIC PAIN EXAM: UTERINE FIBROID EMBOLIZATION Date:  03/09/2021 03/09/2021 11:28 am Radiologist:  M. Daryll Brod, MD Guidance:  ULTRASOUND AND FLUOROSCOPIC MEDICATIONS: Ancef 3 gm IV. The antibiotic was administered within 1 hour of the procedure ANESTHESIA/SEDATION: Fentanyl 250 mcg IV; Versed 6.0 mg IV Moderate Sedation Time:  53 MINUTES The patient was continuously monitored during the procedure by the interventional radiology nurse under my direct supervision. CONTRAST:  56mL OMNIPAQUE IOHEXOL 300 MG/ML SOLN, 36mL OMNIPAQUE IOHEXOL 300 MG/ML SOLN, 142mL OMNIPAQUE IOHEXOL 300 MG/ML SOLN FLUOROSCOPY TIME:  Fluoroscopy Time: 13 minutes 48 seconds (2,472 mGy). COMPLICATIONS: None immediate. PROCEDURE: Informed consent was obtained from the patient following explanation of the procedure, risks, benefits and alternatives. The patient understands, agrees and consents for the procedure. All questions were addressed. A time out was performed. Maximal barrier sterile technique utilized including caps, mask, sterile gowns, sterile gloves, large sterile drape, hand hygiene, and betadine prep. Under sterile conditions and local anesthesia, ultrasound bilateral common femoral artery micropuncture access performed. Images obtained for documentation of the patent common femoral arteries bilaterally. Over Bentson guidewires, bilateral 5 French sheaths inserted. C2 catheters were advanced bilaterally. Initially the left uterine access was performed. The 5 French C2 catheter was advanced into the left common iliac artery.  Initial left common iliac angiogram confirms patency of the left common, internal and external iliac arteries. Over the Bentson guidewire, the 5 [place] C2 catheter was advanced into the internal iliac system. Contrast injection confirms position of the 5 French C2 catheter within the enlarged tortuous left uterine artery. Through the access, the microcatheter was advanced more peripherally into the left uterine artery. Selective left uterine angiogram confirms position of the catheter access and safe for embolization. Next, the right uterine access was performed. In a similar fashion, a C2 catheter was advanced into the right internal iliac artery. Initial right internal iliac angiogram performed. The anterior and posterior divisions are visualized. The tortuous dilated large right in artery was localized. Renegade high flow microcatheter was advanced over a single angled GT Glidewire to the right uterine artery. Selective right uterine angiogram performed. This confirms peripheral position within the right uterine artery and safe for embolization. Next, simultaneous uterine artery injections were performed for complete uterine angiography. Again, this confirms adequate bilateral uterine artery access for embolization. Left uterine artery embolization performed with 2 vials of 500-700 micron embospheres and 4 vials of 700-900 micron embospheres. Right uterine artery embolization performed with 2 vials of 500-700 micron embospheres and 2 vials of 700-900 micron embospheres. Post embolization uterine angiograms confirm stasis within the uterine vasculature bilaterally. Bilateral catheter access removed. Injection of the common femoral artery sheath bilaterally confirms access adequate for ExoSeal. Hemostasis obtained with the ExoSeal bilaterally. The patient tolerated the procedure well. No immediate complication. IMPRESSION: Successful bilateral  uterine artery embolization (U F E) Electronically Signed   By: Jerilynn Mages.   Brogen Duell M.D.   On: 03/09/2021 11:46   IR US Guide Vasc Access Left  Result Date: 03/09/2021 INDICATION: SYMPTOMATIC UTERINE FIBROIDS, ABNORMAL MENSTRUAL BLEEDING, PELVIC PAIN EXAM: UTERINE FIBROID EMBOLIZATION Date:  03/09/2021 03/09/2021 11:28 am Radiologist:  M. Daryll Brod, MD Guidance:  ULTRASOUND AND FLUOROSCOPIC MEDICATIONS: Ancef 3 gm IV. The antibiotic was administered within 1 hour of the procedure ANESTHESIA/SEDATION: Fentanyl 250 mcg IV; Versed 6.0 mg IV Moderate Sedation Time:  19 MINUTES The patient was continuously monitored during the procedure by the interventional radiology nurse under my direct supervision. CONTRAST:  71mL OMNIPAQUE IOHEXOL 300 MG/ML SOLN, 89mL OMNIPAQUE IOHEXOL 300 MG/ML SOLN, 147mL OMNIPAQUE IOHEXOL 300 MG/ML SOLN FLUOROSCOPY TIME:  Fluoroscopy Time: 13 minutes 48 seconds (2,472 mGy). COMPLICATIONS: None immediate. PROCEDURE: Informed consent was obtained from the patient following explanation of the procedure, risks, benefits and alternatives. The patient understands, agrees and consents for the procedure. All questions were addressed. A time out was performed. Maximal barrier sterile technique utilized including caps, mask, sterile gowns, sterile gloves, large sterile drape, hand hygiene, and betadine prep. Under sterile conditions and local anesthesia, ultrasound bilateral common femoral artery micropuncture access performed. Images obtained for documentation of the patent common femoral arteries bilaterally. Over Bentson guidewires, bilateral 5 French sheaths inserted. C2 catheters were advanced bilaterally. Initially the left uterine access was performed. The 5 French C2 catheter was advanced into the left common iliac artery. Initial left common iliac angiogram confirms patency of the left common, internal and external iliac arteries. Over the Bentson guidewire, the 5 [place] C2 catheter was advanced into the internal iliac system. Contrast injection confirms position of  the 5 French C2 catheter within the enlarged tortuous left uterine artery. Through the access, the microcatheter was advanced more peripherally into the left uterine artery. Selective left uterine angiogram confirms position of the catheter access and safe for embolization. Next, the right uterine access was performed. In a similar fashion, a C2 catheter was advanced into the right internal iliac artery. Initial right internal iliac angiogram performed. The anterior and posterior divisions are visualized. The tortuous dilated large right in artery was localized. Renegade high flow microcatheter was advanced over a single angled GT Glidewire to the right uterine artery. Selective right uterine angiogram performed. This confirms peripheral position within the right uterine artery and safe for embolization. Next, simultaneous uterine artery injections were performed for complete uterine angiography. Again, this confirms adequate bilateral uterine artery access for embolization. Left uterine artery embolization performed with 2 vials of 500-700 micron embospheres and 4 vials of 700-900 micron embospheres. Right uterine artery embolization performed with 2 vials of 500-700 micron embospheres and 2 vials of 700-900 micron embospheres. Post embolization uterine angiograms confirm stasis within the uterine vasculature bilaterally. Bilateral catheter access removed. Injection of the common femoral artery sheath bilaterally confirms access adequate for ExoSeal. Hemostasis obtained with the ExoSeal bilaterally. The patient tolerated the procedure well. No immediate complication. IMPRESSION: Successful bilateral uterine artery embolization (U F E) Electronically Signed   By: Jerilynn Mages.  Laporche Martelle M.D.   On: 03/09/2021 11:46   IR US Guide Vasc Access Right  Result Date: 03/09/2021 INDICATION: SYMPTOMATIC UTERINE FIBROIDS, ABNORMAL MENSTRUAL BLEEDING, PELVIC PAIN EXAM: UTERINE FIBROID EMBOLIZATION Date:  03/09/2021 03/09/2021 11:28 am  Radiologist:  M. Daryll Brod, MD Guidance:  ULTRASOUND AND FLUOROSCOPIC MEDICATIONS: Ancef 3 gm IV. The antibiotic was administered within 1 hour of the procedure ANESTHESIA/SEDATION: Fentanyl 250  mcg IV; Versed 6.0 mg IV Moderate Sedation Time:  94 MINUTES The patient was continuously monitored during the procedure by the interventional radiology nurse under my direct supervision. CONTRAST:  89mL OMNIPAQUE IOHEXOL 300 MG/ML SOLN, 58mL OMNIPAQUE IOHEXOL 300 MG/ML SOLN, 117mL OMNIPAQUE IOHEXOL 300 MG/ML SOLN FLUOROSCOPY TIME:  Fluoroscopy Time: 13 minutes 48 seconds (2,472 mGy). COMPLICATIONS: None immediate. PROCEDURE: Informed consent was obtained from the patient following explanation of the procedure, risks, benefits and alternatives. The patient understands, agrees and consents for the procedure. All questions were addressed. A time out was performed. Maximal barrier sterile technique utilized including caps, mask, sterile gowns, sterile gloves, large sterile drape, hand hygiene, and betadine prep. Under sterile conditions and local anesthesia, ultrasound bilateral common femoral artery micropuncture access performed. Images obtained for documentation of the patent common femoral arteries bilaterally. Over Bentson guidewires, bilateral 5 French sheaths inserted. C2 catheters were advanced bilaterally. Initially the left uterine access was performed. The 5 French C2 catheter was advanced into the left common iliac artery. Initial left common iliac angiogram confirms patency of the left common, internal and external iliac arteries. Over the Bentson guidewire, the 5 [place] C2 catheter was advanced into the internal iliac system. Contrast injection confirms position of the 5 French C2 catheter within the enlarged tortuous left uterine artery. Through the access, the microcatheter was advanced more peripherally into the left uterine artery. Selective left uterine angiogram confirms position of the catheter access  and safe for embolization. Next, the right uterine access was performed. In a similar fashion, a C2 catheter was advanced into the right internal iliac artery. Initial right internal iliac angiogram performed. The anterior and posterior divisions are visualized. The tortuous dilated large right in artery was localized. Renegade high flow microcatheter was advanced over a single angled GT Glidewire to the right uterine artery. Selective right uterine angiogram performed. This confirms peripheral position within the right uterine artery and safe for embolization. Next, simultaneous uterine artery injections were performed for complete uterine angiography. Again, this confirms adequate bilateral uterine artery access for embolization. Left uterine artery embolization performed with 2 vials of 500-700 micron embospheres and 4 vials of 700-900 micron embospheres. Right uterine artery embolization performed with 2 vials of 500-700 micron embospheres and 2 vials of 700-900 micron embospheres. Post embolization uterine angiograms confirm stasis within the uterine vasculature bilaterally. Bilateral catheter access removed. Injection of the common femoral artery sheath bilaterally confirms access adequate for ExoSeal. Hemostasis obtained with the ExoSeal bilaterally. The patient tolerated the procedure well. No immediate complication. IMPRESSION: Successful bilateral uterine artery embolization (U F E) Electronically Signed   By: Jerilynn Mages.  Rachyl Wuebker M.D.   On: 03/09/2021 11:46   IR EMBO TUMOR ORGAN ISCHEMIA INFARCT INC GUIDE ROADMAPPING  Result Date: 03/09/2021 INDICATION: SYMPTOMATIC UTERINE FIBROIDS, ABNORMAL MENSTRUAL BLEEDING, PELVIC PAIN EXAM: UTERINE FIBROID EMBOLIZATION Date:  03/09/2021 03/09/2021 11:28 am Radiologist:  M. Daryll Brod, MD Guidance:  ULTRASOUND AND FLUOROSCOPIC MEDICATIONS: Ancef 3 gm IV. The antibiotic was administered within 1 hour of the procedure ANESTHESIA/SEDATION: Fentanyl 250 mcg IV; Versed 6.0 mg IV  Moderate Sedation Time:  62 MINUTES The patient was continuously monitored during the procedure by the interventional radiology nurse under my direct supervision. CONTRAST:  88mL OMNIPAQUE IOHEXOL 300 MG/ML SOLN, 27mL OMNIPAQUE IOHEXOL 300 MG/ML SOLN, 161mL OMNIPAQUE IOHEXOL 300 MG/ML SOLN FLUOROSCOPY TIME:  Fluoroscopy Time: 13 minutes 48 seconds (2,472 mGy). COMPLICATIONS: None immediate. PROCEDURE: Informed consent was obtained from the patient following explanation of the procedure, risks, benefits  and alternatives. The patient understands, agrees and consents for the procedure. All questions were addressed. A time out was performed. Maximal barrier sterile technique utilized including caps, mask, sterile gowns, sterile gloves, large sterile drape, hand hygiene, and betadine prep. Under sterile conditions and local anesthesia, ultrasound bilateral common femoral artery micropuncture access performed. Images obtained for documentation of the patent common femoral arteries bilaterally. Over Bentson guidewires, bilateral 5 French sheaths inserted. C2 catheters were advanced bilaterally. Initially the left uterine access was performed. The 5 French C2 catheter was advanced into the left common iliac artery. Initial left common iliac angiogram confirms patency of the left common, internal and external iliac arteries. Over the Bentson guidewire, the 5 [place] C2 catheter was advanced into the internal iliac system. Contrast injection confirms position of the 5 French C2 catheter within the enlarged tortuous left uterine artery. Through the access, the microcatheter was advanced more peripherally into the left uterine artery. Selective left uterine angiogram confirms position of the catheter access and safe for embolization. Next, the right uterine access was performed. In a similar fashion, a C2 catheter was advanced into the right internal iliac artery. Initial right internal iliac angiogram performed. The anterior  and posterior divisions are visualized. The tortuous dilated large right in artery was localized. Renegade high flow microcatheter was advanced over a single angled GT Glidewire to the right uterine artery. Selective right uterine angiogram performed. This confirms peripheral position within the right uterine artery and safe for embolization. Next, simultaneous uterine artery injections were performed for complete uterine angiography. Again, this confirms adequate bilateral uterine artery access for embolization. Left uterine artery embolization performed with 2 vials of 500-700 micron embospheres and 4 vials of 700-900 micron embospheres. Right uterine artery embolization performed with 2 vials of 500-700 micron embospheres and 2 vials of 700-900 micron embospheres. Post embolization uterine angiograms confirm stasis within the uterine vasculature bilaterally. Bilateral catheter access removed. Injection of the common femoral artery sheath bilaterally confirms access adequate for ExoSeal. Hemostasis obtained with the ExoSeal bilaterally. The patient tolerated the procedure well. No immediate complication. IMPRESSION: Successful bilateral uterine artery embolization (U F E) Electronically Signed   By: Jerilynn Mages.  Cecilee Rosner M.D.   On: 03/09/2021 11:46    Labs:  CBC: Recent Labs    12/23/20 0924 02/01/21 1638 02/06/21 0803 03/09/21 0834  WBC 7.9 8.3 6.5 7.9  HGB 11.2 10.6* 10.1* 9.8*  HCT 36.2 33.9* 31.2* 33.2*  PLT 506* 547* 475.0* 635*    COAGS: Recent Labs    03/09/21 0834  INR 1.0  APTT 25    BMP: Recent Labs    11/08/20 1119 02/06/21 0803 03/06/21 0815 03/09/21 0834  NA 134* 137 138 138  K 4.1 4.5 4.4 5.3*  CL 102 102 104 108  CO2 25 27 24  19*  GLUCOSE 116* 123* 87 115*  BUN 8 10 10 13   CALCIUM 9.2 9.1 9.2 9.4  CREATININE 0.79 0.72 0.80 1.14*  GFRNONAA >60  --   --  >60    LIVER FUNCTION TESTS: Recent Labs    11/08/20 0000 11/08/20 1119 02/06/21 0803  BILITOT <0.2 0.4 0.3   AST 13 8* 8  ALT 12 10 11   ALKPHOS 64 57 50  PROT 7.7 8.1 7.1  ALBUMIN 3.8 3.4* 3.7     Assessment and Plan:  1 month status post successful uterine fibroid embolization performed 03/09/2021 at [facility].  She has recovered at home very well.  Currently back to work.  No  significant abdominal pain or flank pain.  She no longer requires pain medication or even Advil.  Plan: Outpatient follow-up at 3 and 6 months.  Post embolization MRI will be performed at 6 months.   Electronically Signed: Greggory Keen 04/06/2021, 1:16 PM   I spent a total of    40 Minutes in remote  clinical consultation, greater than 50% of which was counseling/coordinating care for this patient status post fibroid embolization.    Visit type: Audio only (telephone). Audio (no video) only due to patient's lack of internet/smartphone capability. Alternative for in-person consultation at [facility], [address], [place], [place]. This visit type was conducted due to national recommendations for restrictions regarding the COVID-19 Pandemic (e.g. social distancing).  This format is felt to be most appropriate for this patient at this time.  All issues noted in this document were discussed and addressed.

## 2021-04-07 ENCOUNTER — Encounter: Payer: Self-pay | Admitting: Family Medicine

## 2021-04-07 ENCOUNTER — Other Ambulatory Visit: Payer: Self-pay

## 2021-04-07 ENCOUNTER — Ambulatory Visit (INDEPENDENT_AMBULATORY_CARE_PROVIDER_SITE_OTHER): Payer: BC Managed Care – PPO | Admitting: Family Medicine

## 2021-04-07 VITALS — BP 140/100 | HR 82 | Temp 97.2°F | Resp 17 | Ht 64.0 in | Wt 358.2 lb

## 2021-04-07 DIAGNOSIS — I10 Essential (primary) hypertension: Secondary | ICD-10-CM | POA: Diagnosis not present

## 2021-04-07 MED ORDER — VALSARTAN-HYDROCHLOROTHIAZIDE 160-12.5 MG PO TABS: 1.0000 | ORAL_TABLET | Freq: Every day | ORAL | 3 refills | Status: DC

## 2021-04-07 NOTE — Assessment & Plan Note (Signed)
SBP has improved but DBP remains elevated.  Will add HCTZ to pt's current dose of Valsartan and continue to monitor BP for improvement.  Thankfully pt is asymptomatic at this time.

## 2021-04-07 NOTE — Patient Instructions (Signed)
Follow up in 6-8 weeks to recheck BP No need for labs today- yay!! We'll switch from Valsartan to Valsartan HCTZ daily- new prescription sent Continue to work on healthy diet and regular exercise- you're down 7 lbs!!! Call with any questions or concerns Hang in there!!

## 2021-04-07 NOTE — Progress Notes (Signed)
   Subjective:    Patient ID: Sarah Faulkner, female    DOB: 1981-05-22, 40 y.o.   MRN: [id number]  HPI HTN- at last visit Valsartan was increased to 160mg  daily.  SBP has improved from 160 --> 140 but DBP remains elevated at 100.  Continues to take OCPs- restarted 2 weeks ago after [place].  No CP, SOB, HAs, visual changes, edema.  Obesity- pt is down 7 lbs since last visit.  She would like to lose 50 lbs by January.  She knows that losing weight will improve A1C and BP.   Review of Systems For ROS see HPI   This visit occurred during the SARS-CoV-2 public health emergency.  Safety protocols were in place, including screening questions prior to the visit, additional usage of staff PPE, and extensive cleaning of exam room while observing appropriate contact time as indicated for disinfecting solutions.      Objective:   Physical Exam Vitals reviewed.  Constitutional:      General: She is not in acute distress.    Appearance: She is well-developed. She is obese.  HENT:     Head: Normocephalic and atraumatic.  Eyes:     Conjunctiva/sclera: Conjunctivae normal.     Pupils: Pupils are equal, round, and reactive to light.  Neck:     Thyroid: No thyromegaly.  Cardiovascular:     Rate and Rhythm: Normal rate and regular rhythm.     Pulses: Normal pulses.     Heart sounds: Normal heart sounds. No murmur heard. Pulmonary:     Effort: Pulmonary effort is normal. No respiratory distress.     Breath sounds: Normal breath sounds.  Abdominal:     General: There is no distension.     Palpations: Abdomen is soft.     Tenderness: There is no abdominal tenderness.  Musculoskeletal:     Cervical back: Normal range of motion and neck supple.     Right lower leg: No edema.     Left lower leg: No edema.  Lymphadenopathy:     Cervical: No cervical adenopathy.  Skin:    General: Skin is warm and dry.  Neurological:     General: No focal deficit present.     Mental Status: She is alert and  oriented to person, place, and time.  Psychiatric:        Mood and Affect: Mood normal.        Behavior: Behavior normal.          Assessment & Plan:

## 2021-04-07 NOTE — Assessment & Plan Note (Signed)
Improving.  Pt is down 7 lbs in the last month.  She is committed to losing weight to improve her A1C and BP.  Her goal is 50 lbs by January.  I think this is a reasonable goal but we talked about focusing on the incremental successes rather than the end point.  Encouraged her to shoot for 2 lbs each week b/c that will definitely add up over time.  Will continue to monitor weight loss progress.

## 2021-04-14 IMAGING — CT CT HEAD WITHOUT CONTRAST
3 series · 16 of 47 positions shown, 19 images · non-contrast
Comparison: None.

CLINICAL DATA: Dizziness, ataxia

EXAM:
CT HEAD WITHOUT CONTRAST
TECHNIQUE: Contiguous axial images were obtained from the base of the skull
through the vertex without intravenous contrast.

[Series 2: head wo · axial · 0.47mm/px · z∈[-138,-13]mm · 10 of 31 slices shown, 13 images]
[im 3/31  brain]
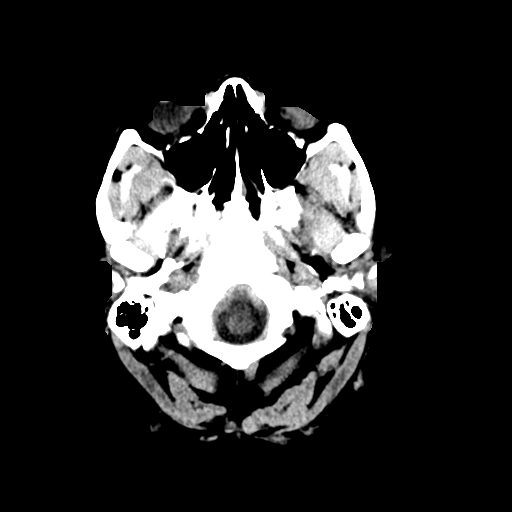
[im 3/31  bone]
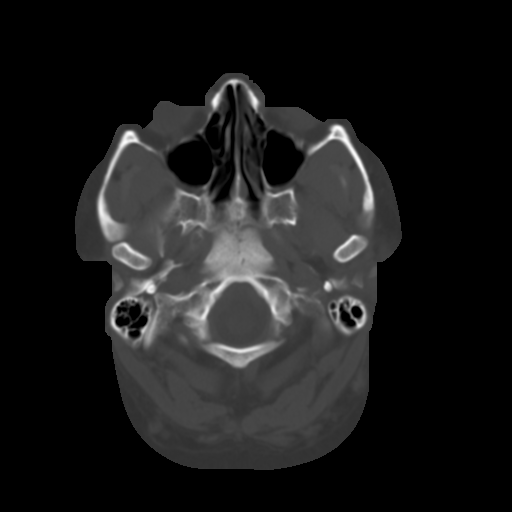
[im 6/31  brain]
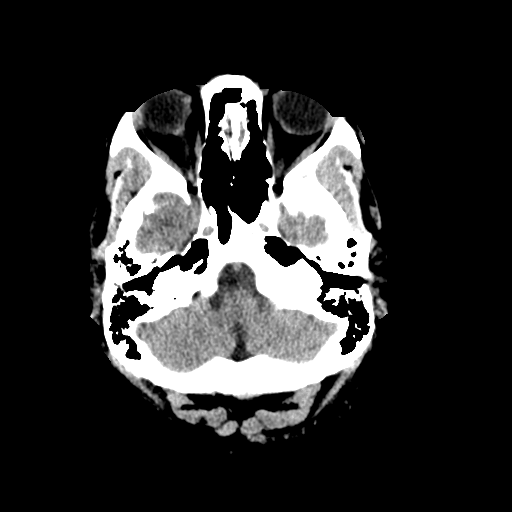
[im 9/31  brain]
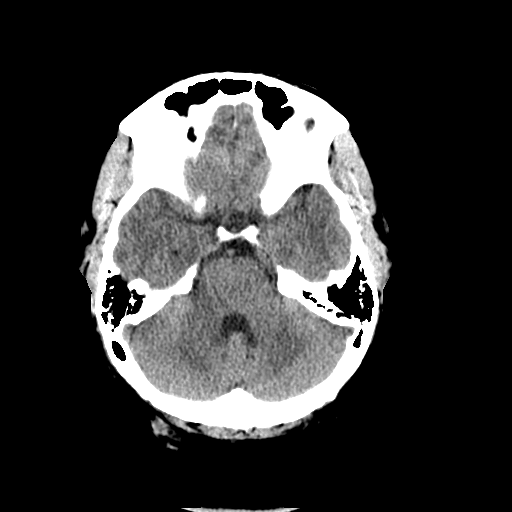
[im 11/31  brain]
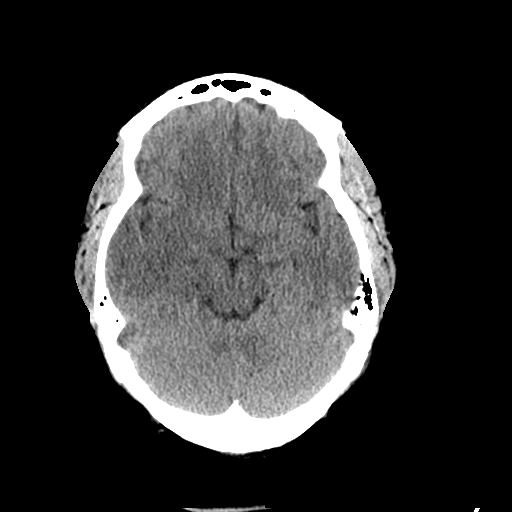
[im 14/31  brain]
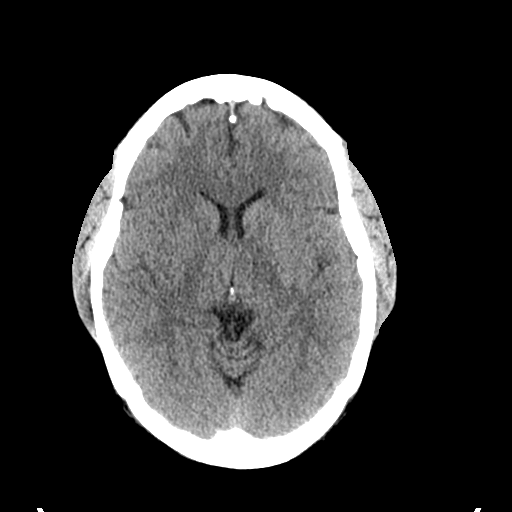
[im 14/31  bone]
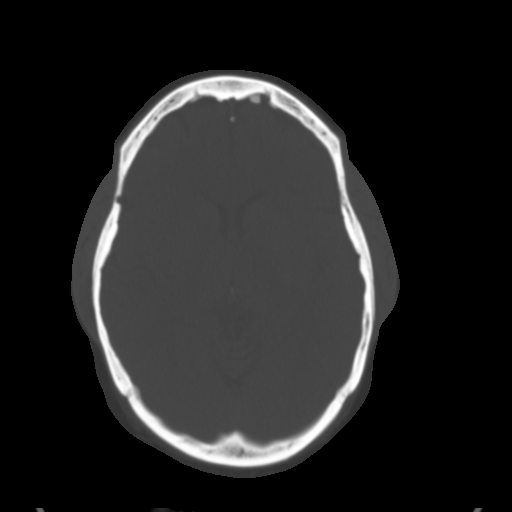
[im 17/31  brain]
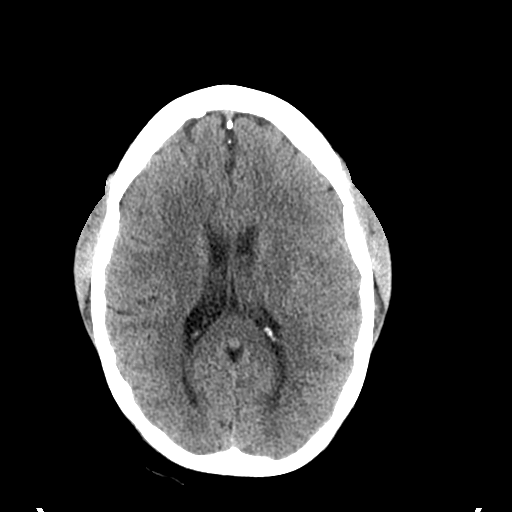
[im 20/31  brain]
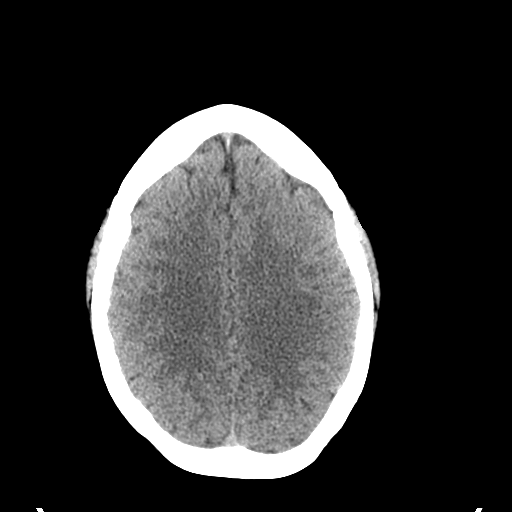
[im 23/31  brain]
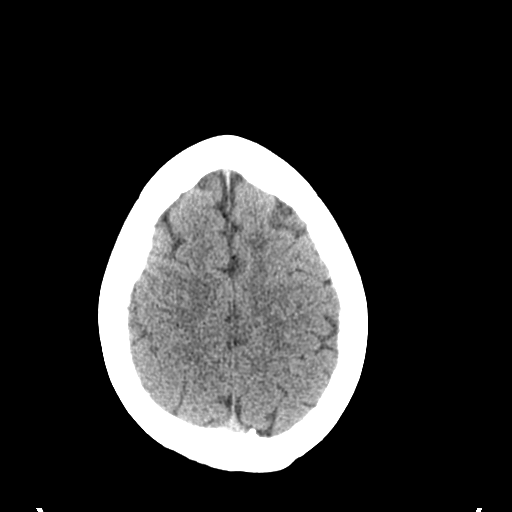
[im 25/31  brain]
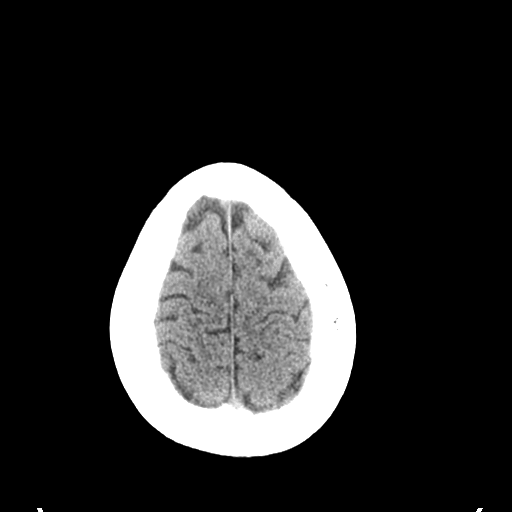
[im 25/31  bone]
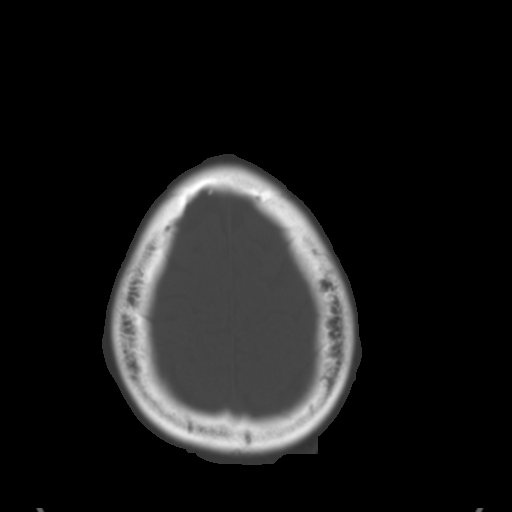
[im 28/31  brain]
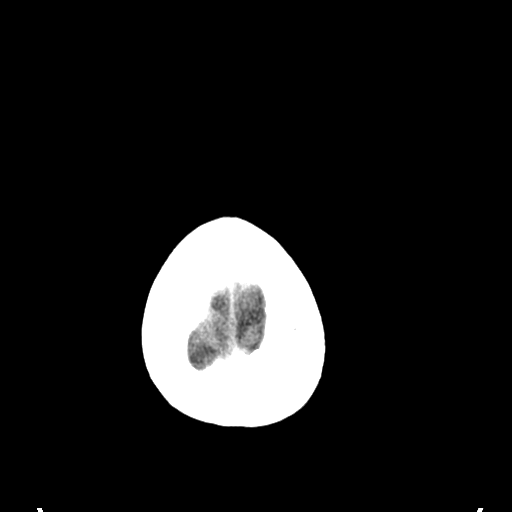

[Series 4: coronal soft tissue · coronal · 0.28mm/px · 3 of 66 slices shown]
[im 22/66  brain]
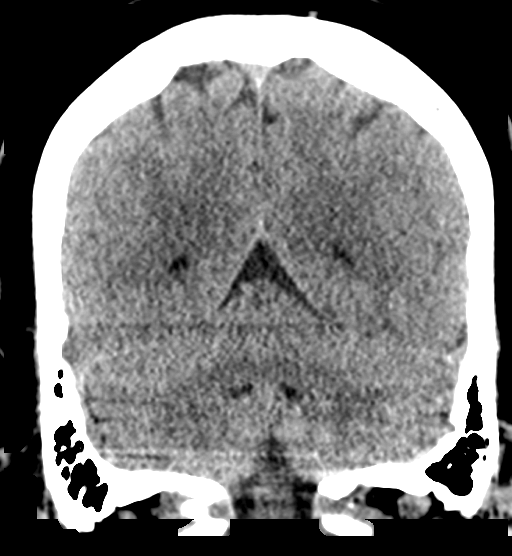
[im 29/66  brain]
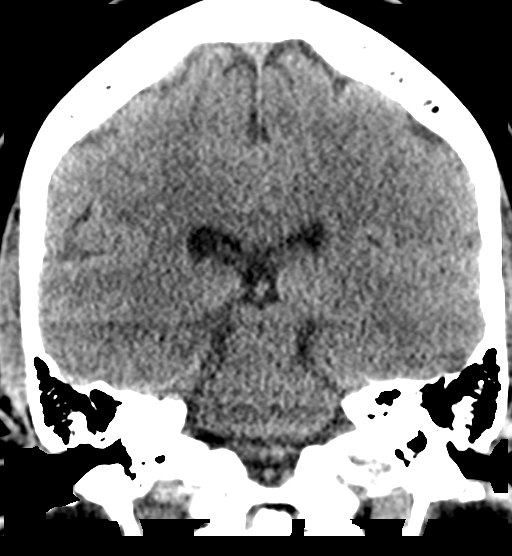
[im 37/66  brain]
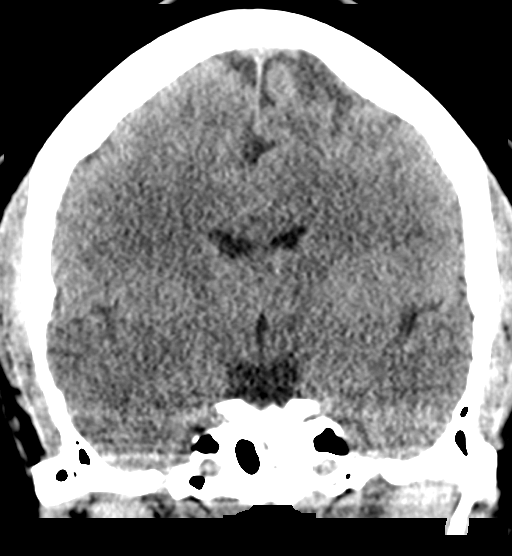

[Series 5: sagittal soft tissue · sagittal · 0.30mm/px · 3 of 50 slices shown]
[im 17/50  brain]
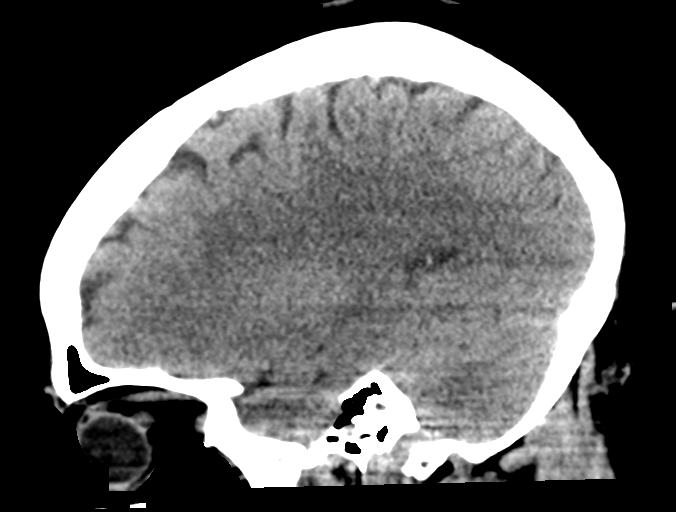
[im 25/50  brain]
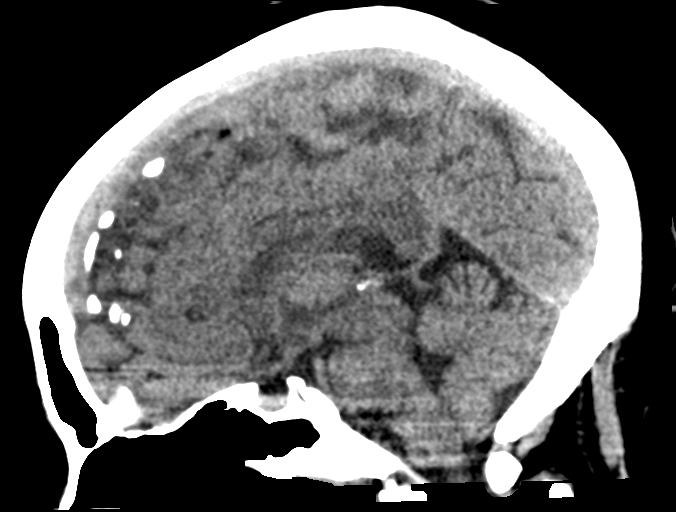
[im 33/50  brain]
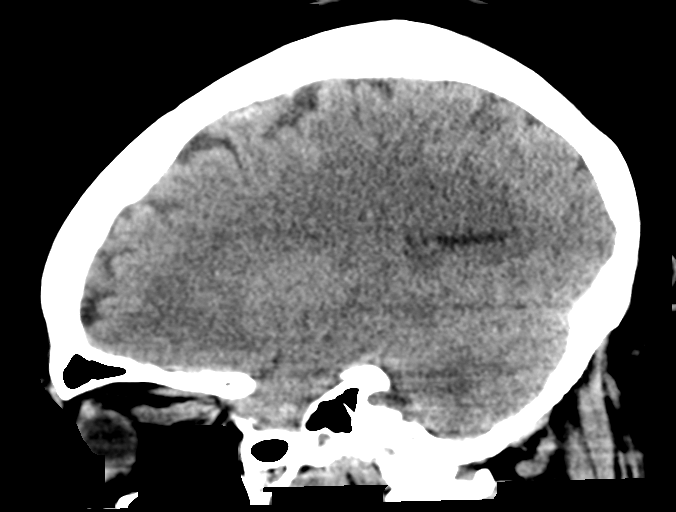

[16 of 47 positions shown; findings below may reference images not displayed]

FINDINGS: Brain: No acute intracranial abnormality. Specifically, no
hemorrhage, hydrocephalus, mass lesion, acute infarction, or
significant intracranial injury.

Vascular: No hyperdense vessel or unexpected calcification.

Skull: No acute calvarial abnormality.

Sinuses/Orbits: Visualized paranasal sinuses and mastoids clear.
Orbital soft tissues unremarkable.

Other: None
IMPRESSION: No acute intracranial abnormality.

## 2021-06-01 ENCOUNTER — Other Ambulatory Visit: Payer: Self-pay | Admitting: Family Medicine

## 2021-06-02 ENCOUNTER — Ambulatory Visit: Payer: BC Managed Care – PPO | Admitting: Family Medicine

## 2021-06-12 ENCOUNTER — Encounter: Payer: Self-pay | Admitting: Family Medicine

## 2021-06-12 ENCOUNTER — Other Ambulatory Visit: Payer: Self-pay

## 2021-06-12 MED ORDER — VALSARTAN-HYDROCHLOROTHIAZIDE 160-12.5 MG PO TABS: 1.0000 | ORAL_TABLET | Freq: Every day | ORAL | 0 refills | Status: DC

## 2021-06-23 ENCOUNTER — Ambulatory Visit: Payer: BC Managed Care – PPO | Admitting: Family Medicine

## 2021-07-10 ENCOUNTER — Other Ambulatory Visit: Payer: Self-pay

## 2021-07-10 ENCOUNTER — Ambulatory Visit (INDEPENDENT_AMBULATORY_CARE_PROVIDER_SITE_OTHER): Payer: BC Managed Care – PPO | Admitting: Family Medicine

## 2021-07-10 ENCOUNTER — Encounter: Payer: Self-pay | Admitting: Family Medicine

## 2021-07-10 VITALS — BP 132/92 | HR 106 | Temp 97.6°F | Resp 18 | Wt 370.0 lb

## 2021-07-10 DIAGNOSIS — E119 Type 2 diabetes mellitus without complications: Secondary | ICD-10-CM

## 2021-07-10 DIAGNOSIS — Z23 Encounter for immunization: Secondary | ICD-10-CM

## 2021-07-10 DIAGNOSIS — I10 Essential (primary) hypertension: Secondary | ICD-10-CM

## 2021-07-10 NOTE — Progress Notes (Signed)
   Subjective:    Patient ID: Sarah Faulkner, female    DOB: June 06, 1981, 40 y.o.   MRN: [id number]  HPI HTN- chronic problem, on Valsartan HCTZ 160/12.5mg  daily.  Both systolic and diastolic BPs have improved but still not at goal.  No CP, SOB, HAs, visual changes, edema.  DM- Currently on Metformin 1000mg  BID.  UTD on eye exam ([place] on [place]), foot exam.  On ARB for renal protection.  Denies symptomatic lows.  No numbness/tingling of hands/feet.  Obesity- pt has gained 12 lbs since July.  Pt reports she walked 5 days last week- 1-2 miles.  Pt talked w/ someone that has lost over 200 lbs and that was very helpful.  Not interested in surgery.   Review of Systems For ROS see HPI   This visit occurred during the SARS-CoV-2 public health emergency.  Safety protocols were in place, including screening questions prior to the visit, additional usage of staff PPE, and extensive cleaning of exam room while observing appropriate contact time as indicated for disinfecting solutions.      Objective:   Physical Exam Vitals reviewed.  Constitutional:      General: She is not in acute distress.    Appearance: Normal appearance. She is well-developed. She is obese. She is not ill-appearing.  HENT:     Head: Normocephalic and atraumatic.  Eyes:     Conjunctiva/sclera: Conjunctivae normal.     Pupils: Pupils are equal, round, and reactive to light.  Neck:     Thyroid: No thyromegaly.  Cardiovascular:     Rate and Rhythm: Normal rate and regular rhythm.     Pulses: Normal pulses.     Heart sounds: Normal heart sounds. No murmur heard. Pulmonary:     Effort: Pulmonary effort is normal. No respiratory distress.     Breath sounds: Normal breath sounds.  Abdominal:     General: There is no distension.     Palpations: Abdomen is soft.     Tenderness: There is no abdominal tenderness.  Musculoskeletal:     Cervical back: Normal range of motion and neck supple.     Right  lower leg: No edema.     Left lower leg: No edema.  Lymphadenopathy:     Cervical: No cervical adenopathy.  Skin:    General: Skin is warm and dry.  Neurological:     Mental Status: She is alert and oriented to person, place, and time.  Psychiatric:        Behavior: Behavior normal.          Assessment & Plan:

## 2021-07-10 NOTE — Patient Instructions (Signed)
Follow up in 3-4 months to recheck sugar and weight loss We'll notify you of your lab results and make any changes if needed Keep up the good work on regular physical activity- you're crushing it!!! Call with any questions or concerns Stay Safe!  Stay Healthy! You Can Do This!!!

## 2021-07-11 LAB — LIPID PANEL
Cholesterol: 98 mg/dL (ref 0–200)
HDL: 45.9 mg/dL (ref >39.00)
LDL Cholesterol: 41 mg/dL (ref 0–99)
NonHDL: 52.27
Total CHOL/HDL Ratio: 2
Triglycerides: 58 mg/dL (ref 0.0–149.0)
VLDL: 11.6 mg/dL (ref 0.0–40.0)

## 2021-07-11 LAB — BASIC METABOLIC PANEL
BUN: 17 mg/dL (ref 6–23)
CO2: 26 mEq/L (ref 19–32)
Calcium: 9.4 mg/dL (ref 8.4–10.5)
Chloride: 101 mEq/L (ref 96–112)
Creatinine, Ser: 0.83 mg/dL (ref 0.40–1.20)
GFR: 88.35 mL/min (ref >60.00)
Glucose, Bld: 95 mg/dL (ref 70–99)
Potassium: 4.6 mEq/L (ref 3.5–5.1)
Sodium: 135 mEq/L (ref 135–145)

## 2021-07-11 LAB — HEPATIC FUNCTION PANEL
ALT: 10 U/L (ref 0–35)
AST: 11 U/L (ref 0–37)
Albumin: 3.8 g/dL (ref 3.5–5.2)
Alkaline Phosphatase: 56 U/L (ref 39–117)
Bilirubin, Direct: 0.1 mg/dL (ref 0.0–0.3)
Total Bilirubin: 0.3 mg/dL (ref 0.2–1.2)
Total Protein: 7.6 g/dL (ref 6.0–8.3)

## 2021-07-11 LAB — CBC WITH DIFFERENTIAL/PLATELET
Basophils Absolute: 0 10*3/uL (ref 0.0–0.1)
Basophils Relative: 0.3 % (ref 0.0–3.0)
Eosinophils Absolute: 0.2 10*3/uL (ref 0.0–0.7)
Eosinophils Relative: 2.1 % (ref 0.0–5.0)
HCT: 31.3 % — ABNORMAL LOW (ref 36.0–46.0)
Hemoglobin: 9.7 g/dL — ABNORMAL LOW (ref 12.0–15.0)
Lymphocytes Relative: 29.8 % (ref 12.0–46.0)
Lymphs Abs: 2.4 10*3/uL (ref 0.7–4.0)
MCHC: 31 g/dL (ref 30.0–36.0)
MCV: 66.2 fl — ABNORMAL LOW (ref 78.0–100.0)
Monocytes Absolute: 0.6 10*3/uL (ref 0.1–1.0)
Monocytes Relative: 7.9 % (ref 3.0–12.0)
Neutro Abs: 4.9 10*3/uL (ref 1.4–7.7)
Neutrophils Relative %: 59.9 % (ref 43.0–77.0)
Platelets: 423 10*3/uL — ABNORMAL HIGH (ref 150.0–400.0)
RBC: 4.72 Mil/uL (ref 3.87–5.11)
RDW: 19.1 % — ABNORMAL HIGH (ref 11.5–15.5)
WBC: 8.1 10*3/uL (ref 4.0–10.5)

## 2021-07-11 LAB — HEMOGLOBIN A1C: Hgb A1c MFr Bld: 7.7 % — ABNORMAL HIGH (ref 4.6–6.5)

## 2021-07-11 LAB — TSH: TSH: 1.99 u[IU]/mL (ref 0.35–5.50)

## 2021-07-13 ENCOUNTER — Encounter: Payer: Self-pay | Admitting: Family Medicine

## 2021-07-23 NOTE — Assessment & Plan Note (Signed)
Chronic problem.  Currently on Metformin 1000mg  BID.  UTd on eye exam, foot exam.  On ARB for renal protection.  Currently asymptomatic.  Check labs.  Adjust meds prn

## 2021-07-23 NOTE — Assessment & Plan Note (Signed)
Deteriorated.  Pt has gained 12 lbs since July but she has made a conscious effort to work on diet and exercise.  Now has a person to talk to regarding weight loss and feels this is very helpful.  Will continue to follow.

## 2021-07-23 NOTE — Assessment & Plan Note (Signed)
Chronic problem.  Adequate control on Valsartan HCTZ.  Will continue to monitor as pt works on weight loss efforts.  Will hold on med changes at this time.  Will follow.

## 2021-07-27 ENCOUNTER — Encounter: Payer: Self-pay | Admitting: Family Medicine

## 2021-08-08 ENCOUNTER — Other Ambulatory Visit: Payer: Self-pay | Admitting: Family Medicine

## 2021-08-09 ENCOUNTER — Other Ambulatory Visit: Payer: Self-pay | Admitting: Interventional Radiology

## 2021-08-09 DIAGNOSIS — D250 Submucous leiomyoma of uterus: Secondary | ICD-10-CM

## 2021-09-03 ENCOUNTER — Other Ambulatory Visit: Payer: Self-pay | Admitting: Family Medicine

## 2021-09-09 ENCOUNTER — Encounter (HOSPITAL_COMMUNITY): Payer: Self-pay

## 2021-09-09 ENCOUNTER — Inpatient Hospital Stay (HOSPITAL_COMMUNITY): Admission: RE | Admit: 2021-09-09 | Payer: BC Managed Care – PPO | Source: Ambulatory Visit

## 2021-09-12 ENCOUNTER — Encounter: Payer: Self-pay | Admitting: Family Medicine

## 2021-09-13 ENCOUNTER — Other Ambulatory Visit: Payer: Self-pay | Admitting: Interventional Radiology

## 2021-09-13 ENCOUNTER — Ambulatory Visit
Admission: RE | Admit: 2021-09-13 | Discharge: 2021-09-13 | Disposition: A | Discharge status: Home / Self Care | Payer: BC Managed Care – PPO | Source: Ambulatory Visit | Attending: Interventional Radiology | Admitting: Interventional Radiology

## 2021-09-13 ENCOUNTER — Ambulatory Visit (INDEPENDENT_AMBULATORY_CARE_PROVIDER_SITE_OTHER): Payer: BC Managed Care – PPO | Admitting: Family Medicine

## 2021-09-13 ENCOUNTER — Other Ambulatory Visit: Payer: Self-pay

## 2021-09-13 ENCOUNTER — Encounter: Payer: Self-pay | Admitting: *Deleted

## 2021-09-13 DIAGNOSIS — D250 Submucous leiomyoma of uterus: Secondary | ICD-10-CM

## 2021-09-13 DIAGNOSIS — Z9889 Other specified postprocedural states: Secondary | ICD-10-CM | POA: Diagnosis not present

## 2021-09-13 DIAGNOSIS — Z23 Encounter for immunization: Secondary | ICD-10-CM | POA: Diagnosis not present

## 2021-09-13 DIAGNOSIS — D259 Leiomyoma of uterus, unspecified: Secondary | ICD-10-CM | POA: Diagnosis not present

## 2021-09-13 HISTORY — PX: IR RADIOLOGIST EVAL & MGMT: IMG5224

## 2021-09-13 NOTE — Progress Notes (Signed)
Patient ID: Sarah Faulkner, female   DOB: 1981/01/27, 40 y.o.   MRN: [id number]       Chief Complaint:  Uterine fibroids, dysfunctional uterine bleeding  Referring Physician(s): Anyanwu  History of Present Illness: Sarah Faulkner is a 40 y.o. female with diabetes, obesity, and large symptomatic uterine fibroids with abnormal menstrual bleeding.  She underwent 5 embolization 03/09/2021 at [facility] about 6 months ago.  Overall she continues to do very well.  No recent illness or fevers.  No abnormal vaginal discharge.  No significant pelvic pain, pressure, urinary tract symptoms or bowel symptoms.  She now has a regular 7-day cycle with very light flow with daily regular pad use.  The menstrual cycles no longer require tampons.  No passes of blood clots anymore.  She had 1 episode of minor injury.  Bleeding but otherwise this is stopped.  She normally requires Megace.  She also reports improvement in how her abdomen and pelvis feel with less distention and bloating and less firmness.  She is back to work.  She has not had her posttreatment 91-month follow-up MRI.  Past Medical History:  Diagnosis Date   Anemia    Chicken pox    Depression    DM (diabetes mellitus), type 2 ([place])    Hypertension     Past Surgical History:  Procedure Laterality Date   CHOLECYSTECTOMY  2005   IR ANGIOGRAM PELVIS SELECTIVE OR SUPRASELECTIVE  03/09/2021   IR ANGIOGRAM SELECTIVE EACH ADDITIONAL VESSEL  03/09/2021   IR ANGIOGRAM SELECTIVE EACH ADDITIONAL VESSEL  03/09/2021   IR EMBO TUMOR ORGAN ISCHEMIA INFARCT INC GUIDE ROADMAPPING  03/09/2021   IR RADIOLOGIST EVAL & MGMT  12/15/2020   IR RADIOLOGIST EVAL & MGMT  04/06/2021   IR US GUIDE VASC ACCESS LEFT  03/09/2021   IR US GUIDE VASC ACCESS RIGHT  03/09/2021    Allergies: Patient has no known allergies.  Medications: Prior to Admission medications   Medication Sig Start Date End Date Taking? Authorizing Provider  Ascorbic Acid (VITAMIN  C) 1000 MG tablet Take 500 mg by mouth daily.    [provider]  cholecalciferol (VITAMIN D3) 25 MCG (1000 UNIT) tablet Take 1,000 Units by mouth daily.    [provider]  ferrous sulfate 325 (65 FE) MG EC tablet Take 325 mg by mouth daily.    [provider]  ibuprofen (ADVIL) 600 MG tablet Take 1 tablet (600 mg total) by mouth every 6 (six) hours. 03/10/21   Han, Aimee H, PA-C  metFORMIN (GLUCOPHAGE) 1000 MG tablet TAKE 1 TABLET(1000 MG) BY MOUTH TWICE DAILY WITH A MEAL 09/04/21   Midge Minium, MD  UNABLE TO FIND Diabetic Supplies:  Glucometer, test strips, lancets. Check glucose BID (fasting and 2 hours after eating). One month supply. Refills PRN. 07/17/11   [provider]  valACYclovir (VALTREX) 1000 MG tablet TAKE 1 TABLET(1000 MG) BY MOUTH DAILY 08/08/21   Midge Minium, MD  valsartan-hydrochlorothiazide (DIOVAN-HCT) 160-12.5 MG tablet Take 1 tablet by mouth daily. 06/12/21   Midge Minium, MD  zinc gluconate 50 MG tablet Take 50 mg by mouth daily.    [provider]     Family History  Problem Relation Age of Onset   Cancer Mother        breast   Heart disease Maternal Grandmother    Stroke Maternal Grandmother    Hypertension Maternal Grandmother    Breast cancer Neg Hx  Social History   Socioeconomic History   Marital status: Single    Spouse name: Not on file   Number of children: Not on file   Years of education: Not on file   Highest education level: Associate degree: academic program  Occupational History   Not on file  Tobacco Use   Smoking status: Never   Smokeless tobacco: Never  Vaping Use   Vaping Use: Never used  Substance and Sexual Activity   Alcohol use: No   Drug use: No   Sexual activity: Not Currently    Birth control/protection: None  Other Topics Concern   Not on file  Social History Narrative   Not on file   Social Determinants of Health   Financial Resource Strain: Not on  file  Food Insecurity: No Food Insecurity   Worried About Running Out of Food in the Last Year: Never true   [place] in the Last Year: Never true  Transportation Needs: No Transportation Needs   Lack of Transportation (Medical): No   Lack of Transportation (Non-Medical): No  Physical Activity: Not on file  Stress: Not on file  Social Connections: Not on file     Review of Systems  Review of Systems: A 12 point ROS discussed and pertinent positives are indicated in the HPI above.  All other systems are negative.  Physical Exam No direct physical exam was performed telephone health visit only today Vital Signs: There were no vitals taken for this visit.  Imaging: No results found.  Labs:  CBC: Recent Labs    02/01/21 1638 02/06/21 0803 03/09/21 0834 07/10/21 1544  WBC 8.3 6.5 7.9 8.1  HGB 10.6* 10.1* 9.8* 9.7*  HCT 33.9* 31.2* 33.2* 31.3*  PLT 547* 475.0* 635* 423.0*    COAGS: Recent Labs    03/09/21 0834  INR 1.0  APTT 25    BMP: Recent Labs    11/08/20 1119 02/06/21 0803 03/06/21 0815 03/09/21 0834 07/10/21 1544  NA 134* 137 138 138 135  K 4.1 4.5 4.4 5.3* 4.6  CL 102 102 104 108 101  CO2 25 27 24  19* 26  GLUCOSE 116* 123* 87 115* 95  BUN 8 10 10 13 17   CALCIUM 9.2 9.1 9.2 9.4 9.4  CREATININE 0.79 0.72 0.80 1.14* 0.83  GFRNONAA >60  --   --  >60  --     LIVER FUNCTION TESTS: Recent Labs    11/08/20 0000 11/08/20 1119 02/06/21 0803 07/10/21 1544  BILITOT <0.2 0.4 0.3 0.3  AST 13 8* 8 11  ALT 12 10 11 10   ALKPHOS 64 57 50 56  PROT 7.7 8.1 7.1 7.6  ALBUMIN 3.8 3.4* 3.7 3.8     Assessment and Plan:  Approximately 52-month status post uterine fibroid embolization performed 03/09/2021 at [facility].  She continues to do very well.  Significant improvement in abdominal and pelvic pain.  Menstrual cycles have also improved significantly as detailed above.  She no longer has heavy menstrual cycle.  She is now asymptomatic.   Overall she is very pleased with the result.  Plan: Scheduled for posttreatment pelvic MRI since it has been 6 months.  Telehealth visit to discuss posttreatment MRI scan.   Electronically Signed: Greggory Keen 09/13/2021, 2:28 PM   I spent a total of    25 Minutes in remote  clinical consultation, greater than 50% of which was counseling/coordinating care for this patient with uterine fibroid status post.    Visit  type: Audio only (telephone). Audio (no video) only due to patient's lack of internet/smartphone capability. Alternative for in-person consultation at [facility], [address], [place], [place]. This visit type was conducted due to national recommendations for restrictions regarding the COVID-19 Pandemic (e.g. social distancing).  This format is felt to be most appropriate for this patient at this time.  All issues noted in this document were discussed and addressed.

## 2021-09-13 NOTE — Progress Notes (Signed)
Sarah Faulkner is a 40 y.o. female presents to the office today for tdap injections, per physician's orders. Patient tolerated well   Juliann Pulse

## 2021-09-14 ENCOUNTER — Encounter (HOSPITAL_COMMUNITY): Payer: Self-pay

## 2021-09-14 ENCOUNTER — Ambulatory Visit (HOSPITAL_COMMUNITY): Admission: RE | Admit: 2021-09-14 | Payer: BC Managed Care – PPO | Source: Ambulatory Visit

## 2021-09-26 ENCOUNTER — Ambulatory Visit
Admission: RE | Admit: 2021-09-26 | Discharge: 2021-09-26 | Disposition: A | Discharge status: Home / Self Care | Payer: BC Managed Care – PPO | Source: Ambulatory Visit | Attending: Interventional Radiology | Admitting: Interventional Radiology

## 2021-09-26 ENCOUNTER — Other Ambulatory Visit: Payer: Self-pay | Admitting: Family Medicine

## 2021-09-26 ENCOUNTER — Other Ambulatory Visit: Payer: Self-pay

## 2021-09-26 DIAGNOSIS — D250 Submucous leiomyoma of uterus: Secondary | ICD-10-CM

## 2021-09-27 ENCOUNTER — Other Ambulatory Visit: Payer: Self-pay | Admitting: Family Medicine

## 2021-09-27 DIAGNOSIS — Z1231 Encounter for screening mammogram for malignant neoplasm of breast: Secondary | ICD-10-CM

## 2021-09-29 ENCOUNTER — Ambulatory Visit
Admission: RE | Admit: 2021-09-29 | Discharge: 2021-09-29 | Disposition: A | Discharge status: Home / Self Care | Payer: BC Managed Care – PPO | Source: Ambulatory Visit

## 2021-09-29 ENCOUNTER — Other Ambulatory Visit: Payer: Self-pay

## 2021-09-29 DIAGNOSIS — Z1231 Encounter for screening mammogram for malignant neoplasm of breast: Secondary | ICD-10-CM | POA: Diagnosis not present

## 2021-10-05 ENCOUNTER — Encounter: Payer: Self-pay | Admitting: Family Medicine

## 2021-10-25 ENCOUNTER — Other Ambulatory Visit: Payer: Self-pay | Admitting: Family Medicine

## 2021-10-25 MED ORDER — METFORMIN HCL 1000 MG PO TABS: ORAL_TABLET | ORAL | 0 refills | Status: DC

## 2021-10-25 MED ORDER — VALSARTAN-HYDROCHLOROTHIAZIDE 160-12.5 MG PO TABS: 1.0000 | ORAL_TABLET | Freq: Every day | ORAL | 0 refills | Status: DC

## 2021-10-31 DIAGNOSIS — R002 Palpitations: Secondary | ICD-10-CM | POA: Insufficient documentation

## 2021-10-31 DIAGNOSIS — I10 Essential (primary) hypertension: Secondary | ICD-10-CM | POA: Diagnosis not present

## 2021-10-31 DIAGNOSIS — R0789 Other chest pain: Secondary | ICD-10-CM | POA: Diagnosis not present

## 2021-10-31 DIAGNOSIS — R0683 Snoring: Secondary | ICD-10-CM | POA: Insufficient documentation

## 2021-11-02 DIAGNOSIS — I10 Essential (primary) hypertension: Secondary | ICD-10-CM | POA: Diagnosis not present

## 2021-11-02 DIAGNOSIS — R0789 Other chest pain: Secondary | ICD-10-CM | POA: Diagnosis not present

## 2021-11-02 DIAGNOSIS — R002 Palpitations: Secondary | ICD-10-CM | POA: Diagnosis not present

## 2021-11-10 ENCOUNTER — Ambulatory Visit: Payer: BC Managed Care – PPO | Admitting: Family Medicine

## 2021-11-23 DIAGNOSIS — R0789 Other chest pain: Secondary | ICD-10-CM | POA: Diagnosis not present

## 2021-11-23 DIAGNOSIS — R002 Palpitations: Secondary | ICD-10-CM | POA: Diagnosis not present

## 2021-11-23 DIAGNOSIS — I10 Essential (primary) hypertension: Secondary | ICD-10-CM | POA: Diagnosis not present

## 2021-12-04 ENCOUNTER — Encounter: Payer: Self-pay | Admitting: Family Medicine

## 2021-12-05 DIAGNOSIS — R0789 Other chest pain: Secondary | ICD-10-CM | POA: Diagnosis not present

## 2021-12-05 DIAGNOSIS — R002 Palpitations: Secondary | ICD-10-CM | POA: Diagnosis not present

## 2021-12-05 DIAGNOSIS — I10 Essential (primary) hypertension: Secondary | ICD-10-CM | POA: Diagnosis not present

## 2021-12-08 ENCOUNTER — Ambulatory Visit (INDEPENDENT_AMBULATORY_CARE_PROVIDER_SITE_OTHER): Payer: BC Managed Care – PPO | Admitting: Family Medicine

## 2021-12-08 ENCOUNTER — Encounter: Payer: Self-pay | Admitting: Family Medicine

## 2021-12-08 VITALS — BP 140/98 | HR 73 | Temp 97.9°F | Resp 16 | Wt 377.2 lb

## 2021-12-08 DIAGNOSIS — E119 Type 2 diabetes mellitus without complications: Secondary | ICD-10-CM

## 2021-12-08 DIAGNOSIS — F419 Anxiety disorder, unspecified: Secondary | ICD-10-CM

## 2021-12-08 DIAGNOSIS — I10 Essential (primary) hypertension: Secondary | ICD-10-CM

## 2021-12-08 DIAGNOSIS — F32A Depression, unspecified: Secondary | ICD-10-CM

## 2021-12-08 LAB — HEPATIC FUNCTION PANEL
ALT: 13 U/L (ref 0–35)
AST: 11 U/L (ref 0–37)
Albumin: 3.9 g/dL (ref 3.5–5.2)
Alkaline Phosphatase: 61 U/L (ref 39–117)
Bilirubin, Direct: 0.1 mg/dL (ref 0.0–0.3)
Total Bilirubin: 0.4 mg/dL (ref 0.2–1.2)
Total Protein: 7.7 g/dL (ref 6.0–8.3)

## 2021-12-08 LAB — BASIC METABOLIC PANEL
BUN: 11 mg/dL (ref 6–23)
CO2: 29 mEq/L (ref 19–32)
Calcium: 9.5 mg/dL (ref 8.4–10.5)
Chloride: 98 mEq/L (ref 96–112)
Creatinine, Ser: 0.76 mg/dL (ref 0.40–1.20)
GFR: 97.92 mL/min (ref >60.00)
Glucose, Bld: 117 mg/dL — ABNORMAL HIGH (ref 70–99)
Potassium: 4.3 mEq/L (ref 3.5–5.1)
Sodium: 134 mEq/L — ABNORMAL LOW (ref 135–145)

## 2021-12-08 LAB — LIPID PANEL
Cholesterol: 95 mg/dL (ref 0–200)
HDL: 49.6 mg/dL (ref >39.00)
LDL Cholesterol: 38 mg/dL (ref 0–99)
NonHDL: 45.33
Total CHOL/HDL Ratio: 2
Triglycerides: 39 mg/dL (ref 0.0–149.0)
VLDL: 7.8 mg/dL (ref 0.0–40.0)

## 2021-12-08 LAB — CBC WITH DIFFERENTIAL/PLATELET
Basophils Absolute: 0.1 10*3/uL (ref 0.0–0.1)
Basophils Relative: 0.7 % (ref 0.0–3.0)
Eosinophils Absolute: 0.1 10*3/uL (ref 0.0–0.7)
Eosinophils Relative: 1 % (ref 0.0–5.0)
HCT: 34.4 % — ABNORMAL LOW (ref 36.0–46.0)
Hemoglobin: 10.7 g/dL — ABNORMAL LOW (ref 12.0–15.0)
Lymphocytes Relative: 26.7 % (ref 12.0–46.0)
Lymphs Abs: 2 10*3/uL (ref 0.7–4.0)
MCHC: 31.3 g/dL (ref 30.0–36.0)
MCV: 68.9 fl — ABNORMAL LOW (ref 78.0–100.0)
Monocytes Absolute: 0.5 10*3/uL (ref 0.1–1.0)
Monocytes Relative: 6.1 % (ref 3.0–12.0)
Neutro Abs: 5 10*3/uL (ref 1.4–7.7)
Neutrophils Relative %: 65.5 % (ref 43.0–77.0)
Platelets: 465 10*3/uL — ABNORMAL HIGH (ref 150.0–400.0)
RBC: 4.99 Mil/uL (ref 3.87–5.11)
RDW: 18.5 % — ABNORMAL HIGH (ref 11.5–15.5)
WBC: 7.7 10*3/uL (ref 4.0–10.5)

## 2021-12-08 LAB — HEMOGLOBIN A1C: Hgb A1c MFr Bld: 8.2 % — ABNORMAL HIGH (ref 4.6–6.5)

## 2021-12-08 LAB — VITAMIN D 25 HYDROXY (VIT D DEFICIENCY, FRACTURES): VITD: 78.25 ng/mL (ref 30.00–100.00)

## 2021-12-08 LAB — TSH: TSH: 1.81 u[IU]/mL (ref 0.35–5.50)

## 2021-12-08 MED ORDER — ALPRAZOLAM 0.5 MG PO TABS: 0.5000 mg | ORAL_TABLET | Freq: Two times a day (BID) | ORAL | 1 refills | Status: DC | PRN

## 2021-12-08 MED ORDER — FLUOXETINE HCL 10 MG PO CAPS: 10.0000 mg | ORAL_CAPSULE | Freq: Every day | ORAL | 3 refills | Status: DC

## 2021-12-08 NOTE — Assessment & Plan Note (Signed)
Deteriorated.  BP is elevated today but pt admits to being very anxious about this appt.  BP was 138/78 at cardiology on 3/14.  Prior to adjusting meds, will attempt to get anxiety under better control.  Pt is agreeable to this and will return for f/u in 1 month. ?

## 2021-12-08 NOTE — Assessment & Plan Note (Signed)
Deteriorated.  Pt is very anxious and tearful today.  She is upset about her weight, feels that she's in a 'vicious cycle'.  Not sleeping well.  Open to idea of medication to improve sxs.  Will start Fluoxetine and use Alprazolam as needed.  Pt expressed understanding and is in agreement w/ plan.  ?

## 2021-12-08 NOTE — Patient Instructions (Signed)
Follow up in 1 month to recheck mood ?We'll notify you of your lab results and make any changes if needed ?START the Fluoxetine once daily ?USE the Alprazolam as needed for panicked moments.  Start w/ a half tab and increase to full tab if needed ?I think if we can get the anxiety under better control the other things will start to fall into place ?Call with any questions or concerns ?Hang in there!  You've got this!!! ?

## 2021-12-08 NOTE — Assessment & Plan Note (Signed)
Chronic problem.  Has eye exam scheduled.  UTD on foot exam, on ARB for renal protection.  Given her A1C and obesity she would be a great candidate for a GLP1 to treat diabetes and obesity but prior to starting this we need to control her anxiety.  Pt agrees.  We will table this discussion until at least the next appt. ?

## 2021-12-08 NOTE — Assessment & Plan Note (Signed)
Deteriorated.  Pt has gained 7 lbs since October.  BMI now 64.75.  She is a great candidate for a GLP1 and we will likely start this at next visit as long as anxiety is better controlled.  Pt expressed understanding and is in agreement w/ plan.  ?

## 2021-12-08 NOTE — Progress Notes (Signed)
? ?  Subjective:  ? ? Patient ID: Sarah Faulkner, female    DOB: 07-04-1981, 41 y.o.   MRN: [id number] ? ?HPI ?HTN- chronic problem, on Valsartan HCTZ 160/12.'5mg'$  daily, Metoprolol '25mg'$  daily (new addition).  BP is elevated today.  Pt reports feeling stressed and anxious this morning.  Does not check home BPs.  BP at Cardiology on 3/14 was 138/78.  No CP, SOB, HAs, visual changes, edema. ? ?DM- chronic problem, currently on Metformin '1000mg'$  BID.  On ARB for renal protection.  Due for eye exam- scheduled for April.  UTD on foot exam.  Last A1C 7.7%  Denies symptomatic lows. ? ?Obesity- pt has gained 7 lbs since October.  BMI now 64.75 lbs ? ?Anxiety- pt reports anxiety has been high since death of her aunt in 09/03/2023.  Pt reports she was having palpitations which prompted a Cardiology eval.  Thankfully this was normal.  Pt is open to the idea of medication.  Previously on Sertraline but didn't like how she felt.  Pt has not been sleeping well- only 3-4 hrs/night. ? ? ?Review of Systems ?For ROS see HPI  ? ?This visit occurred during the SARS-CoV-2 public health emergency.  Safety protocols were in place, including screening questions prior to the visit, additional usage of staff PPE, and extensive cleaning of exam room while observing appropriate contact time as indicated for disinfecting solutions.   ?   ?Objective:  ? Physical Exam ?Vitals reviewed.  ?Constitutional:   ?   Appearance: She is obese. She is not ill-appearing.  ?HENT:  ?   Head: Normocephalic and atraumatic.  ?Eyes:  ?   Extraocular Movements: Extraocular movements intact.  ?   Conjunctiva/sclera: Conjunctivae normal.  ?   Pupils: Pupils are equal, round, and reactive to light.  ?Cardiovascular:  ?   Rate and Rhythm: Normal rate and regular rhythm.  ?   Pulses: Normal pulses.  ?   Heart sounds: Normal heart sounds.  ?Pulmonary:  ?   Effort: Pulmonary effort is normal. No respiratory distress.  ?   Breath sounds: Normal breath sounds. No wheezing or  rhonchi.  ?Musculoskeletal:  ?   Cervical back: Normal range of motion and neck supple.  ?   Right lower leg: No edema.  ?   Left lower leg: No edema.  ?Skin: ?   General: Skin is warm and dry.  ?Neurological:  ?   General: No focal deficit present.  ?   Mental Status: She is alert and oriented to person, place, and time.  ?Psychiatric:  ?   Comments: Tearful, anxious  ? ? ? ? ? ?   ?Assessment & Plan:  ? ? ?

## 2021-12-13 DIAGNOSIS — I10 Essential (primary) hypertension: Secondary | ICD-10-CM | POA: Diagnosis not present

## 2021-12-13 DIAGNOSIS — R0789 Other chest pain: Secondary | ICD-10-CM | POA: Diagnosis not present

## 2021-12-13 DIAGNOSIS — R002 Palpitations: Secondary | ICD-10-CM | POA: Diagnosis not present

## 2022-01-11 ENCOUNTER — Encounter: Payer: Self-pay | Admitting: Family Medicine

## 2022-01-11 ENCOUNTER — Ambulatory Visit: Payer: BC Managed Care – PPO | Admitting: Family Medicine

## 2022-01-11 VITALS — BP 138/90 | HR 82 | Temp 98.1°F | Resp 16 | Wt 369.4 lb

## 2022-01-11 DIAGNOSIS — F419 Anxiety disorder, unspecified: Secondary | ICD-10-CM | POA: Diagnosis not present

## 2022-01-11 DIAGNOSIS — E119 Type 2 diabetes mellitus without complications: Secondary | ICD-10-CM | POA: Diagnosis not present

## 2022-01-11 DIAGNOSIS — I10 Essential (primary) hypertension: Secondary | ICD-10-CM

## 2022-01-11 DIAGNOSIS — F32A Depression, unspecified: Secondary | ICD-10-CM

## 2022-01-11 MED ORDER — OZEMPIC (0.25 OR 0.5 MG/DOSE) 2 MG/1.5ML ~~LOC~~ SOPN: 0.5000 mg | PEN_INJECTOR | SUBCUTANEOUS | 1 refills | Status: DC

## 2022-01-11 NOTE — Patient Instructions (Signed)
Follow up in 4-6 weeks to recheck Ozempic ?No need for labs today!!! ?Start the Ozempic once weekly ?Keep up the good work on [employer], increased physical activity- you're doing great!!! ?Continue the Fluoxetine daily and the Alprazolam as needed ?Call with any questions of concerns ?KEEP IT UP GIRL!!! ?

## 2022-01-11 NOTE — Assessment & Plan Note (Signed)
Chronic problem.  BP is again mildly elevated today but I suspect this will continue to improve w/ additional weight loss.  No med changes at this time but will follow closely. ?

## 2022-01-11 NOTE — Assessment & Plan Note (Signed)
Improved w/ the addition of Prozac daily and Alprazolam as needed.  Pt reports feeling much better and she is like a whole different person today.  Her smile is back, her energy level is high, she is wearing bright colors.  No changes at this time. ?

## 2022-01-11 NOTE — Assessment & Plan Note (Signed)
Pt is down 8 lbs since last visit.  She is now walking regularly, is eating at home more often, not eating late at night.  Applauded her efforts.  Will start Ozempic to improve both sugars and help w/ additional weight loss ?

## 2022-01-11 NOTE — Progress Notes (Signed)
? ?  Subjective:  ? ? Patient ID: Sarah Faulkner, female    DOB: 08/20/1981, 41 y.o.   MRN: [id number] ? ?HPI ?Anxiety/depression- pt started Fluoxetine '10mg'$  daily and added Alprazolam as needed.  Pt reports feeling 'a lot better'.  Feels medication has been very helpful.  Less tearful, less wound up, less sad.  Sleeping better. ? ?Obesity- down 8 lbs since last visit.  Pt is walking regularly.  Has adjusted her eating times and is no longer eating late.  Now eating at home rather than out. ? ?HTN- BP is again mildly elevated today.  On valsartan HCTZ 160/12.'5mg'$  daily.  No CP, SOB, HAs, visual changes, edema. ? ? ?Review of Systems ?For ROS see HPI  ?   ?Objective:  ? Physical Exam ?Vitals reviewed.  ?Constitutional:   ?   General: She is not in acute distress. ?   Appearance: Normal appearance. She is obese. She is not ill-appearing.  ?HENT:  ?   Head: Normocephalic and atraumatic.  ?Eyes:  ?   Extraocular Movements: Extraocular movements intact.  ?   Conjunctiva/sclera: Conjunctivae normal.  ?Cardiovascular:  ?   Rate and Rhythm: Normal rate.  ?Pulmonary:  ?   Effort: Pulmonary effort is normal.  ?   Breath sounds: Normal breath sounds.  ?Skin: ?   General: Skin is warm and dry.  ?Neurological:  ?   General: No focal deficit present.  ?   Mental Status: She is alert and oriented to person, place, and time.  ?Psychiatric:     ?   Mood and Affect: Mood normal.     ?   Behavior: Behavior normal.     ?   Thought Content: Thought content normal.  ? ? ? ? ? ?   ?Assessment & Plan:  ? ? ?

## 2022-01-11 NOTE — Assessment & Plan Note (Signed)
Pt's mindset is in a better place and she has started to make dietary changes.  In order to facilitate better sugar control and additional weight loss, will start Ozempic weekly and titrate as needed.  Pt expressed understanding and is in agreement w/ plan.  ?

## 2022-01-16 ENCOUNTER — Encounter: Payer: Self-pay | Admitting: Family Medicine

## 2022-01-30 ENCOUNTER — Other Ambulatory Visit: Payer: Self-pay

## 2022-01-30 DIAGNOSIS — E119 Type 2 diabetes mellitus without complications: Secondary | ICD-10-CM

## 2022-01-30 MED ORDER — METFORMIN HCL 1000 MG PO TABS: ORAL_TABLET | ORAL | 0 refills | Status: DC

## 2022-02-01 ENCOUNTER — Other Ambulatory Visit: Payer: Self-pay

## 2022-02-01 ENCOUNTER — Encounter: Payer: Self-pay | Admitting: Family Medicine

## 2022-02-01 ENCOUNTER — Other Ambulatory Visit: Payer: Self-pay | Admitting: Family Medicine

## 2022-02-01 DIAGNOSIS — E119 Type 2 diabetes mellitus without complications: Secondary | ICD-10-CM

## 2022-02-01 MED ORDER — METFORMIN HCL 1000 MG PO TABS: 1000.0000 mg | ORAL_TABLET | Freq: Two times a day (BID) | ORAL | 3 refills | Status: DC

## 2022-02-15 ENCOUNTER — Ambulatory Visit (INDEPENDENT_AMBULATORY_CARE_PROVIDER_SITE_OTHER): Payer: BC Managed Care – PPO | Admitting: Family Medicine

## 2022-02-15 ENCOUNTER — Other Ambulatory Visit: Payer: Self-pay

## 2022-02-15 ENCOUNTER — Encounter: Payer: Self-pay | Admitting: Family Medicine

## 2022-02-15 DIAGNOSIS — I10 Essential (primary) hypertension: Secondary | ICD-10-CM | POA: Diagnosis not present

## 2022-02-15 LAB — BASIC METABOLIC PANEL
BUN: 11 mg/dL (ref 6–23)
CO2: 25 mEq/L (ref 19–32)
Calcium: 9.6 mg/dL (ref 8.4–10.5)
Chloride: 101 mEq/L (ref 96–112)
Creatinine, Ser: 0.79 mg/dL (ref 0.40–1.20)
GFR: 93.35 mL/min (ref >60.00)
Glucose, Bld: 105 mg/dL — ABNORMAL HIGH (ref 70–99)
Potassium: 4.1 mEq/L (ref 3.5–5.1)
Sodium: 136 mEq/L (ref 135–145)

## 2022-02-15 MED ORDER — SEMAGLUTIDE (1 MG/DOSE) 4 MG/3ML ~~LOC~~ SOPN: 1.0000 mg | PEN_INJECTOR | SUBCUTANEOUS | 1 refills | Status: DC

## 2022-02-15 NOTE — Assessment & Plan Note (Signed)
Chronic problem.  BP is at goal for first time in a long time.  Currently tolerating Metoprolol '25mg'$  BID and Valsartan HCTZ 160/12.'5mg'$  daily w/o difficulty.  Check labs due to ARB and diuretic but no anticipated med changes.

## 2022-02-15 NOTE — Assessment & Plan Note (Signed)
Ongoing issue for pt.  She has lost another 5 lbs since last visit.  She thought she would have lost more weight since being on Ozempic but we discussed that she is on a low dose.  Will increase to '1mg'$  weekly in addition to her efforts at diet and exercise.  Applauded her increased walking and encouraged her to continue.  Will continue to follow.

## 2022-02-15 NOTE — Progress Notes (Signed)
   Subjective:    Patient ID: Sarah Faulkner, female    DOB: June 16, 1981, 41 y.o.   MRN: [id number]  HPI Obesity- pt started Ozempic at last visit.  Pt is down another 5 lbs.  No side effects from Ozempic.  Pt is open to idea of increasing dose b/c she thought she would lose more weight.  Pt notes decreased appetite.  'i feel so much better'.  Has started walking more frequently- now watching to grocery store 1.5 miles away rather than driving.  HTN- chronic problem.  BP is at goal today at 130/88.  Currently on Metoprolol '25mg'$  BID and Valsartan HCTZ 160/12.'5mg'$  daily.  Pt reports feeling good.  No CP, SOB, HAs, visual changes, edema.   Review of Systems For ROS see HPI     Objective:   Physical Exam Vitals reviewed.  Constitutional:      General: She is not in acute distress.    Appearance: Normal appearance. She is well-developed. She is obese.  HENT:     Head: Normocephalic and atraumatic.  Eyes:     Conjunctiva/sclera: Conjunctivae normal.     Pupils: Pupils are equal, round, and reactive to light.  Neck:     Thyroid: No thyromegaly.  Cardiovascular:     Rate and Rhythm: Normal rate and regular rhythm.     Pulses: Normal pulses.     Heart sounds: Normal heart sounds. No murmur heard. Pulmonary:     Effort: Pulmonary effort is normal. No respiratory distress.     Breath sounds: Normal breath sounds.  Abdominal:     General: There is no distension.     Palpations: Abdomen is soft.     Tenderness: There is no abdominal tenderness.  Musculoskeletal:     Cervical back: Normal range of motion and neck supple.     Right lower leg: No edema.     Left lower leg: No edema.  Lymphadenopathy:     Cervical: No cervical adenopathy.  Skin:    General: Skin is warm and dry.  Neurological:     Mental Status: She is alert and oriented to person, place, and time.  Psychiatric:        Behavior: Behavior normal.          Assessment & Plan:

## 2022-02-15 NOTE — Patient Instructions (Signed)
Follow up in 2 months to recheck diabetes and weight loss progress We'll notify you of your lab results and make any changes if needed Continue to work on healthy diet and regular exercise- you're doing great!!! Increase the Ozempic to '1mg'$  weekly Call with any questions or concerns [place]!!!

## 2022-02-15 NOTE — Progress Notes (Signed)
Pt reviewed lab results Via My chart

## 2022-03-08 ENCOUNTER — Ambulatory Visit (HOSPITAL_BASED_OUTPATIENT_CLINIC_OR_DEPARTMENT_OTHER): Payer: BC Managed Care – PPO | Admitting: Orthopaedic Surgery

## 2022-03-08 ENCOUNTER — Ambulatory Visit (INDEPENDENT_AMBULATORY_CARE_PROVIDER_SITE_OTHER): Payer: BC Managed Care – PPO

## 2022-03-08 DIAGNOSIS — M25512 Pain in left shoulder: Secondary | ICD-10-CM | POA: Diagnosis not present

## 2022-03-08 DIAGNOSIS — G2589 Other specified extrapyramidal and movement disorders: Secondary | ICD-10-CM | POA: Diagnosis not present

## 2022-03-08 DIAGNOSIS — G8929 Other chronic pain: Secondary | ICD-10-CM

## 2022-03-08 NOTE — Progress Notes (Signed)
Chief Complaint: Left shoulder pain     History of Present Illness:    Sarah Faulkner is a 41 y.o. female presents with left shoulder pain and upper arm pain that has been on and off for several years although worse for the course the last month.  She denied any specific injury.  She is experiencing pain in the upper trapezius.  She does use ice as needed.  She has had a massage for this in the past which helps.  She works from home.    Surgical History:   None  PMH/PSH/Family History/Social History/Meds/Allergies:    Past Medical History:  Diagnosis Date   Anemia    Chicken pox    Depression    DM (diabetes mellitus), type 2 ([place])    Hypertension    Past Surgical History:  Procedure Laterality Date   CHOLECYSTECTOMY  2005   IR ANGIOGRAM PELVIS SELECTIVE OR SUPRASELECTIVE  03/09/2021   IR ANGIOGRAM SELECTIVE EACH ADDITIONAL VESSEL  03/09/2021   IR ANGIOGRAM SELECTIVE EACH ADDITIONAL VESSEL  03/09/2021   IR EMBO TUMOR ORGAN ISCHEMIA INFARCT INC GUIDE ROADMAPPING  03/09/2021   IR RADIOLOGIST EVAL & MGMT  12/15/2020   IR RADIOLOGIST EVAL & MGMT  04/06/2021   IR RADIOLOGIST EVAL & MGMT  09/13/2021   IR US GUIDE VASC ACCESS LEFT  03/09/2021   IR US GUIDE VASC ACCESS RIGHT  03/09/2021   Social History   Socioeconomic History   Marital status: Single    Spouse name: Not on file   Number of children: Not on file   Years of education: Not on file   Highest education level: Associate degree: academic program  Occupational History   Not on file  Tobacco Use   Smoking status: Never   Smokeless tobacco: Never  Vaping Use   Vaping Use: Never used  Substance and Sexual Activity   Alcohol use: No   Drug use: No   Sexual activity: Not Currently    Birth control/protection: None  Other Topics Concern   Not on file  Social History Narrative   Not on file   Social Determinants of Health   Financial Resource Strain: Not on file  Food  Insecurity: No Food Insecurity (02/01/2021)   Hunger Vital Sign    Worried About Running Out of Food in the Last Year: Never true    Ran Out of Food in the Last Year: Never true  Transportation Needs: No Transportation Needs (02/01/2021)   PRAPARE - Hydrologist (Medical): No    Lack of Transportation (Non-Medical): No  Physical Activity: Not on file  Stress: Not on file  Social Connections: Not on file   Family History  Problem Relation Age of Onset   Cancer Mother 52       breast   Breast cancer Mother 85   Heart disease Maternal Grandmother    Stroke Maternal Grandmother    Hypertension Maternal Grandmother    No Known Allergies Current Outpatient Medications  Medication Sig Dispense Refill   ALPRAZolam (XANAX) 0.5 MG tablet Take 1 tablet (0.5 mg total) by mouth 2 (two) times daily as needed for anxiety. 30 tablet 1   Ascorbic Acid (VITAMIN C) 1000 MG tablet Take 500 mg by mouth daily.     cholecalciferol (VITAMIN  D3) 25 MCG (1000 UNIT) tablet Take 1,000 Units by mouth daily.     ferrous sulfate 325 (65 FE) MG EC tablet Take 325 mg by mouth daily.     FLUoxetine (PROZAC) 10 MG capsule Take 1 capsule (10 mg total) by mouth daily. 30 capsule 3   metFORMIN (GLUCOPHAGE) 1000 MG tablet Take 1 tablet (1,000 mg total) by mouth 2 (two) times daily with a meal. 180 tablet 3   metoprolol tartrate (LOPRESSOR) 25 MG tablet Take 25 mg by mouth 2 (two) times daily.     Semaglutide, 1 MG/DOSE, 4 MG/3ML SOPN Inject 1 mg as directed once a week. 3 mL 1   UNABLE TO FIND Diabetic Supplies:  Glucometer, test strips, lancets. Check glucose BID (fasting and 2 hours after eating). One month supply. Refills PRN.     valACYclovir (VALTREX) 1000 MG tablet TAKE 1 TABLET(1000 MG) BY MOUTH DAILY 30 tablet 3   valsartan-hydrochlorothiazide (DIOVAN-HCT) 160-12.5 MG tablet Take 1 tablet by mouth daily. 90 tablet 0   zinc gluconate 50 MG tablet Take 50 mg by mouth daily.     No  current facility-administered medications for this visit.   No results found.  Review of Systems:   A ROS was performed including pertinent positives and negatives as documented in the HPI.  Physical Exam :   Constitutional: NAD and appears stated age Neurological: Alert and oriented Psych: Appropriate affect and cooperative There were no vitals taken for this visit.   Comprehensive Musculoskeletal Exam:    Musculoskeletal Exam    Inspection Right Left  Skin No atrophy or winging Positive winging  Palpation    Tenderness None Upper trapezius  Range of Motion    Flexion (passive) 170 170  Flexion (active) 170 170  Abduction 170 170  ER at the side 70 70  Can reach behind back to T12 T12  Strength     Full full  Special Tests    Pseudoparalytic No No  Neurologic    Fires PIN, radial, median, ulnar, musculocutaneous, axillary, suprascapular, long thoracic, and spinal accessory innervated muscles. No abnormal sensibility  Vascular/Lymphatic    Radial Pulse 2+ 2+  Cervical Exam    Patient has symmetric cervical range of motion with negative Spurling's test.  Special Test:      Imaging:   Xray (3 views left shoulder): Normal    I personally reviewed and interpreted the radiographs.   Assessment:   41 y.o. female with left scapular dyskinesis and upper trapezial pain.  At today's visit I believe she would benefit from a significant scapular strengthening program for rhomboids serratus anterior.  She may also be a candidate for dry needling of the trapezius.  I will plan to see her back as needed  Plan :    -Return to clinic as needed     I personally saw and evaluated the patient, and participated in the management and treatment plan.  Vanetta Mulders, MD Attending Physician, Orthopedic Surgery  This document was dictated using Dragon voice recognition software. A reasonable attempt at proof reading has been made to minimize errors.

## 2022-03-15 ENCOUNTER — Ambulatory Visit (HOSPITAL_BASED_OUTPATIENT_CLINIC_OR_DEPARTMENT_OTHER): Payer: BC Managed Care – PPO | Admitting: Physical Therapy

## 2022-04-18 ENCOUNTER — Ambulatory Visit: Payer: BC Managed Care – PPO | Admitting: Family Medicine

## 2022-04-23 ENCOUNTER — Telehealth: Payer: Self-pay

## 2022-04-23 ENCOUNTER — Other Ambulatory Visit: Payer: Self-pay

## 2022-04-23 DIAGNOSIS — E119 Type 2 diabetes mellitus without complications: Secondary | ICD-10-CM

## 2022-04-23 MED ORDER — ALPRAZOLAM 0.5 MG PO TABS: 0.5000 mg | ORAL_TABLET | Freq: Two times a day (BID) | ORAL | 1 refills | Status: DC | PRN

## 2022-04-23 MED ORDER — SEMAGLUTIDE (1 MG/DOSE) 4 MG/3ML ~~LOC~~ SOPN: 1.0000 mg | PEN_INJECTOR | SUBCUTANEOUS | 1 refills | Status: DC

## 2022-04-23 NOTE — Telephone Encounter (Signed)
Prescription sent to pharmacy.

## 2022-04-26 ENCOUNTER — Telehealth: Payer: Self-pay

## 2022-04-26 ENCOUNTER — Other Ambulatory Visit: Payer: Self-pay

## 2022-04-26 DIAGNOSIS — E119 Type 2 diabetes mellitus without complications: Secondary | ICD-10-CM

## 2022-04-26 MED ORDER — SEMAGLUTIDE (1 MG/DOSE) 4 MG/3ML ~~LOC~~ SOPN: 1.0000 mg | PEN_INJECTOR | SUBCUTANEOUS | 1 refills | Status: DC

## 2022-04-27 MED ORDER — ALPRAZOLAM 0.5 MG PO TABS: 0.5000 mg | ORAL_TABLET | Freq: Two times a day (BID) | ORAL | 1 refills | Status: DC | PRN

## 2022-04-27 NOTE — Telephone Encounter (Signed)
Prescription sent to pharmacy.

## 2022-05-03 ENCOUNTER — Telehealth: Payer: Self-pay

## 2022-05-03 MED ORDER — ALPRAZOLAM 0.5 MG PO TABS: 0.5000 mg | ORAL_TABLET | Freq: Two times a day (BID) | ORAL | 1 refills | Status: DC | PRN

## 2022-05-03 NOTE — Telephone Encounter (Signed)
Prescription sent to pharmacy.

## 2022-05-16 ENCOUNTER — Ambulatory Visit: Payer: BC Managed Care – PPO | Admitting: Family Medicine

## 2022-05-24 ENCOUNTER — Ambulatory Visit: Payer: BC Managed Care – PPO | Admitting: Family Medicine

## 2022-05-24 ENCOUNTER — Encounter: Payer: Self-pay | Admitting: Family Medicine

## 2022-05-24 VITALS — BP 126/84 | HR 75 | Temp 98.1°F | Resp 18 | Ht 64.0 in | Wt 358.1 lb

## 2022-05-24 DIAGNOSIS — E119 Type 2 diabetes mellitus without complications: Secondary | ICD-10-CM

## 2022-05-24 DIAGNOSIS — I10 Essential (primary) hypertension: Secondary | ICD-10-CM

## 2022-05-24 LAB — LIPID PANEL
Cholesterol: 109 mg/dL (ref 0–200)
HDL: 47 mg/dL (ref >39.00)
LDL Cholesterol: 52 mg/dL (ref 0–99)
NonHDL: 61.68
Total CHOL/HDL Ratio: 2
Triglycerides: 50 mg/dL (ref 0.0–149.0)
VLDL: 10 mg/dL (ref 0.0–40.0)

## 2022-05-24 LAB — CBC WITH DIFFERENTIAL/PLATELET
Basophils Absolute: 0.1 10*3/uL (ref 0.0–0.1)
Basophils Relative: 1.1 % (ref 0.0–3.0)
Eosinophils Absolute: 0.1 10*3/uL (ref 0.0–0.7)
Eosinophils Relative: 1.2 % (ref 0.0–5.0)
HCT: 36.2 % (ref 36.0–46.0)
Hemoglobin: 11.5 g/dL — ABNORMAL LOW (ref 12.0–15.0)
Lymphocytes Relative: 27 % (ref 12.0–46.0)
Lymphs Abs: 2.3 10*3/uL (ref 0.7–4.0)
MCHC: 31.8 g/dL (ref 30.0–36.0)
MCV: 71.2 fl — ABNORMAL LOW (ref 78.0–100.0)
Monocytes Absolute: 0.5 10*3/uL (ref 0.1–1.0)
Monocytes Relative: 6 % (ref 3.0–12.0)
Neutro Abs: 5.6 10*3/uL (ref 1.4–7.7)
Neutrophils Relative %: 64.7 % (ref 43.0–77.0)
Platelets: 521 10*3/uL — ABNORMAL HIGH (ref 150.0–400.0)
RBC: 5.09 Mil/uL (ref 3.87–5.11)
RDW: 17.4 % — ABNORMAL HIGH (ref 11.5–15.5)
WBC: 8.7 10*3/uL (ref 4.0–10.5)

## 2022-05-24 LAB — MICROALBUMIN / CREATININE URINE RATIO
Creatinine,U: 99.5 mg/dL
Microalb Creat Ratio: 1 mg/g (ref 0.0–30.0)
Microalb, Ur: 1 mg/dL (ref 0.0–1.9)

## 2022-05-24 LAB — HEPATIC FUNCTION PANEL
ALT: 14 U/L (ref 0–35)
AST: 12 U/L (ref 0–37)
Albumin: 4 g/dL (ref 3.5–5.2)
Alkaline Phosphatase: 59 U/L (ref 39–117)
Bilirubin, Direct: 0.1 mg/dL (ref 0.0–0.3)
Total Bilirubin: 0.4 mg/dL (ref 0.2–1.2)
Total Protein: 8.8 g/dL — ABNORMAL HIGH (ref 6.0–8.3)

## 2022-05-24 LAB — HEMOGLOBIN A1C: Hgb A1c MFr Bld: 6.9 % — ABNORMAL HIGH (ref 4.6–6.5)

## 2022-05-24 LAB — BASIC METABOLIC PANEL
BUN: 11 mg/dL (ref 6–23)
CO2: 24 mEq/L (ref 19–32)
Calcium: 9.8 mg/dL (ref 8.4–10.5)
Chloride: 98 mEq/L (ref 96–112)
Creatinine, Ser: 0.83 mg/dL (ref 0.40–1.20)
GFR: 87.82 mL/min (ref >60.00)
Glucose, Bld: 149 mg/dL — ABNORMAL HIGH (ref 70–99)
Potassium: 3.4 mEq/L — ABNORMAL LOW (ref 3.5–5.1)
Sodium: 133 mEq/L — ABNORMAL LOW (ref 135–145)

## 2022-05-24 LAB — TSH: TSH: 2.28 u[IU]/mL (ref 0.35–5.50)

## 2022-05-24 MED ORDER — SEMAGLUTIDE (1 MG/DOSE) 4 MG/3ML ~~LOC~~ SOPN: 1.0000 mg | PEN_INJECTOR | SUBCUTANEOUS | 3 refills | Status: DC

## 2022-05-24 NOTE — Assessment & Plan Note (Signed)
Pt is down 6 lbs.  She is now walking ~2 miles daily!!  Less cravings and decreased appetite on Ozempic.  Applauded her efforts and will continue to follow.

## 2022-05-24 NOTE — Progress Notes (Signed)
   Subjective:    Patient ID: Sarah Faulkner, female    DOB: 11/13/1980, 41 y.o.   MRN: [id number]  HPI HTN- chronic problem, on Metoprolol '25mg'$  BID and Valsartan HCTZ 160/12.'5mg'$  daily w/ good control.  No CP, SOB, HAs, visual changes, edema.  DM- chronic problem, on Ozempic '1mg'$  weekly and Metformin '1000mg'$  BID.  Due for microalbumin, foot exam, eye exam.  Pt to schedule eye exam.  No abd pain, N/V.  No numbness/tingling of hands/feet  Obesity- pt is down 6 lbs since 5/25.  BMI 61.47.  Pt has started walking ~2 miles/day.  Ozempic has helped decrease appetite and limit cravings.   Review of Systems For ROS see HPI     Objective:   Physical Exam Vitals reviewed.  Constitutional:      General: She is not in acute distress.    Appearance: Normal appearance. She is well-developed. She is obese. She is not ill-appearing.  HENT:     Head: Normocephalic and atraumatic.  Eyes:     Conjunctiva/sclera: Conjunctivae normal.     Pupils: Pupils are equal, round, and reactive to light.  Neck:     Thyroid: No thyromegaly.  Cardiovascular:     Rate and Rhythm: Normal rate and regular rhythm.     Heart sounds: Normal heart sounds. No murmur heard. Pulmonary:     Effort: Pulmonary effort is normal. No respiratory distress.     Breath sounds: Normal breath sounds.  Abdominal:     General: There is no distension.     Palpations: Abdomen is soft.     Tenderness: There is no abdominal tenderness.  Musculoskeletal:     Cervical back: Normal range of motion and neck supple.  Lymphadenopathy:     Cervical: No cervical adenopathy.  Skin:    General: Skin is warm and dry.  Neurological:     General: No focal deficit present.     Mental Status: She is alert and oriented to person, place, and time.  Psychiatric:        Mood and Affect: Mood normal.        Behavior: Behavior normal.           Assessment & Plan:

## 2022-05-24 NOTE — Assessment & Plan Note (Signed)
Chronic problem.  On Ozempic '1mg'$  weekly and Metformin '1000mg'$  BID w/o difficulty.  Currently asymptomatic.  Foot exam done today.  Microalbumin ordered.  Pt to schedule eye exam.  Check labs.  Adjust meds prn

## 2022-05-24 NOTE — Patient Instructions (Signed)
Follow up in 3-4 months to recheck diabetes and weight loss We'll notify you of your lab results and make any changes if needed Continue to work on low carb diet and regular exercise- you're DOING it!!! Call and schedule your eye exam and have them send me their report Call with any questions or concerns Stay Safe!  Stay Healthy! Safe travels!!!

## 2022-05-24 NOTE — Assessment & Plan Note (Signed)
Chronic problem.  Well controlled on Metoprolol '25mg'$  BID and Valsartan HCTZ 160/12.'5mg'$  daily.  Asymptomatic.  Will check labs due to ARB and diuretic use but no anticipated med changes.

## 2022-05-25 ENCOUNTER — Telehealth: Payer: Self-pay

## 2022-05-25 NOTE — Telephone Encounter (Signed)
-----   Message from Midge Minium, MD sent at 05/25/2022  8:58 AM EDT ----- BOOM!  Look at that A1C!!!  Keep up the great work!!  Remainder of labs are stable and look good.  No changes at this time

## 2022-07-03 ENCOUNTER — Other Ambulatory Visit: Payer: Self-pay | Admitting: Family Medicine

## 2022-07-04 MED ORDER — SEMAGLUTIDE (2 MG/DOSE) 8 MG/3ML ~~LOC~~ SOPN: 2.0000 mg | PEN_INJECTOR | SUBCUTANEOUS | 1 refills | Status: DC

## 2022-07-04 NOTE — Telephone Encounter (Signed)
Prescription changed to the '2mg'$  dose as the '1mg'$  dose was unavailable.  Hopefully they will have this higher dose in stock

## 2022-07-13 ENCOUNTER — Other Ambulatory Visit: Payer: Self-pay

## 2022-07-13 MED ORDER — VALSARTAN-HYDROCHLOROTHIAZIDE 160-12.5 MG PO TABS: 1.0000 | ORAL_TABLET | Freq: Every day | ORAL | 0 refills | Status: DC

## 2022-08-07 LAB — HM DIABETES EYE EXAM

## 2022-08-08 ENCOUNTER — Encounter: Payer: Self-pay | Admitting: Family Medicine

## 2022-08-31 ENCOUNTER — Encounter: Payer: Self-pay | Admitting: Family Medicine

## 2022-09-25 ENCOUNTER — Other Ambulatory Visit: Payer: Self-pay

## 2022-09-25 ENCOUNTER — Ambulatory Visit: Payer: BC Managed Care – PPO | Admitting: Family Medicine

## 2022-09-25 DIAGNOSIS — E119 Type 2 diabetes mellitus without complications: Secondary | ICD-10-CM

## 2022-09-25 MED ORDER — SEMAGLUTIDE (2 MG/DOSE) 8 MG/3ML ~~LOC~~ SOPN: 2.0000 mg | PEN_INJECTOR | SUBCUTANEOUS | 1 refills | Status: DC

## 2022-10-16 ENCOUNTER — Telehealth: Payer: Self-pay | Admitting: *Deleted

## 2022-10-16 NOTE — Patient Outreach (Signed)
  Care Coordination   10/16/2022 Name: Sarah Faulkner MRN: [id number] DOB: 1981/08/27   Care Coordination Outreach Attempts:  An unsuccessful telephone outreach was attempted today to offer the patient information about available care coordination services as a benefit of their health plan.   Follow Up Plan:  Additional outreach attempts will be made to offer the patient care coordination information and services.   Encounter Outcome:  No Answer   Care Coordination Interventions:  No, not indicated    Raina Mina, RN Care Management Coordinator [place] [facility] [phone]

## 2022-11-09 ENCOUNTER — Ambulatory Visit: Payer: BC Managed Care – PPO | Admitting: Family Medicine

## 2023-01-10 ENCOUNTER — Other Ambulatory Visit: Payer: Self-pay

## 2023-01-10 DIAGNOSIS — E119 Type 2 diabetes mellitus without complications: Secondary | ICD-10-CM

## 2023-01-10 MED ORDER — METFORMIN HCL 1000 MG PO TABS: 1000.0000 mg | ORAL_TABLET | Freq: Two times a day (BID) | ORAL | 1 refills | Status: DC

## 2023-01-28 ENCOUNTER — Other Ambulatory Visit: Payer: Self-pay | Admitting: Family Medicine

## 2023-01-28 DIAGNOSIS — E119 Type 2 diabetes mellitus without complications: Secondary | ICD-10-CM

## 2023-02-06 ENCOUNTER — Ambulatory Visit: Payer: BC Managed Care – PPO | Admitting: Family Medicine

## 2023-02-06 VITALS — BP 136/84 | HR 81 | Temp 97.8°F | Resp 18 | Ht 64.0 in | Wt 366.4 lb

## 2023-02-06 DIAGNOSIS — I10 Essential (primary) hypertension: Secondary | ICD-10-CM | POA: Diagnosis not present

## 2023-02-06 DIAGNOSIS — Z7984 Long term (current) use of oral hypoglycemic drugs: Secondary | ICD-10-CM | POA: Diagnosis not present

## 2023-02-06 DIAGNOSIS — E119 Type 2 diabetes mellitus without complications: Secondary | ICD-10-CM | POA: Diagnosis not present

## 2023-02-06 LAB — TSH: TSH: 2.44 u[IU]/mL (ref 0.35–5.50)

## 2023-02-06 LAB — BASIC METABOLIC PANEL
BUN: 10 mg/dL (ref 6–23)
CO2: 29 mEq/L (ref 19–32)
Calcium: 9.3 mg/dL (ref 8.4–10.5)
Chloride: 101 mEq/L (ref 96–112)
Creatinine, Ser: 0.73 mg/dL (ref 0.40–1.20)
GFR: 101.94 mL/min (ref >60.00)
Glucose, Bld: 97 mg/dL (ref 70–99)
Potassium: 4.2 mEq/L (ref 3.5–5.1)
Sodium: 137 mEq/L (ref 135–145)

## 2023-02-06 LAB — CBC WITH DIFFERENTIAL/PLATELET
Basophils Absolute: 0 10*3/uL (ref 0.0–0.1)
Basophils Relative: 0.5 % (ref 0.0–3.0)
Eosinophils Absolute: 0.1 10*3/uL (ref 0.0–0.7)
Eosinophils Relative: 1.3 % (ref 0.0–5.0)
HCT: 35.6 % — ABNORMAL LOW (ref 36.0–46.0)
Hemoglobin: 11.3 g/dL — ABNORMAL LOW (ref 12.0–15.0)
Lymphocytes Relative: 33.2 % (ref 12.0–46.0)
Lymphs Abs: 2.6 10*3/uL (ref 0.7–4.0)
MCHC: 31.8 g/dL (ref 30.0–36.0)
MCV: 71.2 fl — ABNORMAL LOW (ref 78.0–100.0)
Monocytes Absolute: 0.5 10*3/uL (ref 0.1–1.0)
Monocytes Relative: 6.4 % (ref 3.0–12.0)
Neutro Abs: 4.7 10*3/uL (ref 1.4–7.7)
Neutrophils Relative %: 58.6 % (ref 43.0–77.0)
Platelets: 432 10*3/uL — ABNORMAL HIGH (ref 150.0–400.0)
RBC: 4.99 Mil/uL (ref 3.87–5.11)
RDW: 17.1 % — ABNORMAL HIGH (ref 11.5–15.5)
WBC: 8 10*3/uL (ref 4.0–10.5)

## 2023-02-06 LAB — HEMOGLOBIN A1C: Hgb A1c MFr Bld: 6.8 % — ABNORMAL HIGH (ref 4.6–6.5)

## 2023-02-06 LAB — MICROALBUMIN / CREATININE URINE RATIO
Creatinine,U: 164.8 mg/dL
Microalb Creat Ratio: 0.4 mg/g (ref 0.0–30.0)
Microalb, Ur: <0.7 mg/dL (ref 0.0–1.9)

## 2023-02-06 LAB — LIPID PANEL
Cholesterol: 103 mg/dL (ref 0–200)
HDL: 48.6 mg/dL (ref >39.00)
LDL Cholesterol: 47 mg/dL (ref 0–99)
NonHDL: 54.61
Total CHOL/HDL Ratio: 2
Triglycerides: 39 mg/dL (ref 0.0–149.0)
VLDL: 7.8 mg/dL (ref 0.0–40.0)

## 2023-02-06 LAB — HEPATIC FUNCTION PANEL
ALT: 13 U/L (ref 0–35)
AST: 13 U/L (ref 0–37)
Albumin: 3.7 g/dL (ref 3.5–5.2)
Alkaline Phosphatase: 56 U/L (ref 39–117)
Bilirubin, Direct: 0.1 mg/dL (ref 0.0–0.3)
Total Bilirubin: 0.3 mg/dL (ref 0.2–1.2)
Total Protein: 7.7 g/dL (ref 6.0–8.3)

## 2023-02-06 LAB — VITAMIN D 25 HYDROXY (VIT D DEFICIENCY, FRACTURES): VITD: 42.28 ng/mL (ref 30.00–100.00)

## 2023-02-06 MED ORDER — VALSARTAN-HYDROCHLOROTHIAZIDE 160-12.5 MG PO TABS: 1.0000 | ORAL_TABLET | Freq: Every day | ORAL | 1 refills | Status: DC

## 2023-02-06 NOTE — Patient Instructions (Addendum)
Schedule your complete physical in 6 months We'll notify you of your lab results and make any changes if needed Continue to work on healthy diet and regular exercise- you can do it! RESTART the Valsartan HCTZ daily Call with any questions or concerns Stay Safe!  Stay Healthy! Hang in there!!!  I'm proud of you!!!

## 2023-02-06 NOTE — Progress Notes (Signed)
   Subjective:    Patient ID: Sarah Faulkner, female    DOB: 1980-09-25, 42 y.o.   MRN: [id number]  HPI DM- chronic problem, on Metformin 1000mg  BID, Ozempic 2mg /week.  Rare symptomatic lows.  No numbness/tingling of hands/feet.  Obesity- pt has gained 8 lbs since last visit.  Pt plans to resume exercising after a 'really hard 7 months'.  Trying to follow low carb diet.  HTN- chronic problem, on Metoprolol 25mg  BID (not taking), Valsartan HCTZ 160/12.5mg  (not taking) daily w/ adequate control.  No CP, SOB, HA's, visual changes, edema.   Review of Systems For ROS see HPI     Objective:   Physical Exam Vitals reviewed.  Constitutional:      General: She is not in acute distress.    Appearance: Normal appearance. She is well-developed. She is obese. She is not ill-appearing.  HENT:     Head: Normocephalic and atraumatic.  Eyes:     Conjunctiva/sclera: Conjunctivae normal.     Pupils: Pupils are equal, round, and reactive to light.  Neck:     Thyroid: No thyromegaly.  Cardiovascular:     Rate and Rhythm: Normal rate and regular rhythm.     Pulses: Normal pulses.     Heart sounds: Normal heart sounds. No murmur heard. Pulmonary:     Effort: Pulmonary effort is normal. No respiratory distress.     Breath sounds: Normal breath sounds.  Abdominal:     General: There is no distension.     Palpations: Abdomen is soft.     Tenderness: There is no abdominal tenderness.  Musculoskeletal:     Cervical back: Normal range of motion and neck supple.     Right lower leg: No edema.     Left lower leg: No edema.  Lymphadenopathy:     Cervical: No cervical adenopathy.  Skin:    General: Skin is warm and dry.  Neurological:     General: No focal deficit present.     Mental Status: She is alert and oriented to person, place, and time.  Psychiatric:        Mood and Affect: Mood normal.        Behavior: Behavior normal.        Thought Content: Thought content normal.            Assessment & Plan:
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
## 2023-02-06 NOTE — Assessment & Plan Note (Signed)
Chronic problem.  Has been off medication since running out.  Will restart Valsartan HCTZ 160/12.5mg  daily and d/c metoprolol.  Pt expressed understanding and is in agreement w/ plan.

## 2023-02-06 NOTE — Assessment & Plan Note (Signed)
Chronic problem.  On Metformin 1000mg  BID and Ozempic 2mg /week.  She has had a turbulent 7 months but wants to re-commit to taking care of herself.  UTD on foot exam, eye exam.  Will get microalbumin.  Check labs.  Adjust meds prn

## 2023-02-07 ENCOUNTER — Telehealth: Payer: Self-pay

## 2023-02-07 NOTE — Telephone Encounter (Signed)
Pt see results Via my chart

## 2023-02-07 NOTE — Telephone Encounter (Signed)
-----   Message from Sheliah Hatch, MD sent at 02/07/2023  7:30 AM EDT ----- Labs are stable and look good!  No med changes at this time (with exception of restarting BP medication as discussed)

## 2023-06-06 ENCOUNTER — Other Ambulatory Visit: Payer: Self-pay | Admitting: Family Medicine

## 2023-06-06 DIAGNOSIS — E119 Type 2 diabetes mellitus without complications: Secondary | ICD-10-CM

## 2023-07-10 ENCOUNTER — Encounter: Payer: Self-pay | Admitting: Family Medicine

## 2023-07-10 ENCOUNTER — Other Ambulatory Visit: Payer: Self-pay | Admitting: Family Medicine

## 2023-07-10 DIAGNOSIS — E119 Type 2 diabetes mellitus without complications: Secondary | ICD-10-CM

## 2023-07-10 MED ORDER — DEXCOM G7 RECEIVER DEVI: 0 refills | Status: DC

## 2023-07-10 MED ORDER — DEXCOM G7 SENSOR MISC: 3 refills | Status: DC

## 2023-07-10 NOTE — Telephone Encounter (Signed)
Patient requesting RX for CGM okay to send or should patient be seen first? Last OV 02/06/23

## 2023-07-26 ENCOUNTER — Other Ambulatory Visit (HOSPITAL_COMMUNITY): Payer: Self-pay

## 2023-07-26 ENCOUNTER — Telehealth: Payer: Self-pay | Admitting: Pharmacy Technician

## 2023-07-26 NOTE — Telephone Encounter (Signed)
Pharmacy Patient Advocate Encounter   Received notification from CoverMyMeds that prior authorization for Dexcom G7 Sensor is required/requested.   Insurance verification completed.   The patient is insured through [employer] .   Per test claim: PA required; PA submitted to above mentioned insurance via CoverMyMeds Key/confirmation #/EOC [id number] Status is pending

## 2023-07-26 NOTE — Telephone Encounter (Signed)
Pharmacy Patient Advocate Encounter  Received notification from [facility] that Prior Authorization for Dexcom G7 sensor  has been DENIED.  Full denial letter will be uploaded to the media tab. See denial reason below.

## 2023-07-29 NOTE — Telephone Encounter (Signed)
Tried to call pt and unable to get through on the number on file.

## 2023-07-29 NOTE — Telephone Encounter (Signed)
Called twice and phone states unable to be dialed Sent letter out

## 2023-08-06 ENCOUNTER — Other Ambulatory Visit: Payer: Self-pay | Admitting: Family Medicine

## 2023-08-13 ENCOUNTER — Ambulatory Visit: Payer: BC Managed Care – PPO | Admitting: Family Medicine

## 2023-08-13 ENCOUNTER — Encounter: Payer: Self-pay | Admitting: Family Medicine

## 2023-08-13 VITALS — BP 138/88 | HR 73 | Temp 97.9°F | Ht 64.0 in | Wt 354.0 lb

## 2023-08-13 DIAGNOSIS — Z7984 Long term (current) use of oral hypoglycemic drugs: Secondary | ICD-10-CM | POA: Diagnosis not present

## 2023-08-13 DIAGNOSIS — E559 Vitamin D deficiency, unspecified: Secondary | ICD-10-CM | POA: Diagnosis not present

## 2023-08-13 DIAGNOSIS — Z0000 Encounter for general adult medical examination without abnormal findings: Secondary | ICD-10-CM

## 2023-08-13 DIAGNOSIS — Z7985 Long-term (current) use of injectable non-insulin antidiabetic drugs: Secondary | ICD-10-CM

## 2023-08-13 DIAGNOSIS — E119 Type 2 diabetes mellitus without complications: Secondary | ICD-10-CM

## 2023-08-13 LAB — LIPID PANEL
Cholesterol: 108 mg/dL (ref 0–200)
HDL: 41.8 mg/dL
LDL Cholesterol: 58 mg/dL (ref 0–99)
NonHDL: 65.75
Total CHOL/HDL Ratio: 3
Triglycerides: 41 mg/dL (ref 0.0–149.0)
VLDL: 8.2 mg/dL (ref 0.0–40.0)

## 2023-08-13 LAB — BASIC METABOLIC PANEL
BUN: 17 mg/dL (ref 6–23)
CO2: 28 meq/L (ref 19–32)
Calcium: 9.4 mg/dL (ref 8.4–10.5)
Chloride: 103 meq/L (ref 96–112)
Creatinine, Ser: 0.85 mg/dL (ref 0.40–1.20)
GFR: 84.61 mL/min (ref >60.00)
Glucose, Bld: 98 mg/dL (ref 70–99)
Potassium: 4.1 meq/L (ref 3.5–5.1)
Sodium: 137 meq/L (ref 135–145)

## 2023-08-13 LAB — CBC WITH DIFFERENTIAL/PLATELET
Basophils Absolute: 0 10*3/uL (ref 0.0–0.1)
Basophils Relative: 0.5 % (ref 0.0–3.0)
Eosinophils Absolute: 0.1 10*3/uL (ref 0.0–0.7)
Eosinophils Relative: 1.7 % (ref 0.0–5.0)
HCT: 36.2 % (ref 36.0–46.0)
Hemoglobin: 11.2 g/dL — ABNORMAL LOW (ref 12.0–15.0)
Lymphocytes Relative: 31.9 % (ref 12.0–46.0)
Lymphs Abs: 2.5 10*3/uL (ref 0.7–4.0)
MCHC: 31 g/dL (ref 30.0–36.0)
MCV: 74.3 fL — ABNORMAL LOW (ref 78.0–100.0)
Monocytes Absolute: 0.6 10*3/uL (ref 0.1–1.0)
Monocytes Relative: 7.2 % (ref 3.0–12.0)
Neutro Abs: 4.6 10*3/uL (ref 1.4–7.7)
Neutrophils Relative %: 58.7 % (ref 43.0–77.0)
Platelets: 476 10*3/uL — ABNORMAL HIGH (ref 150.0–400.0)
RBC: 4.88 Mil/uL (ref 3.87–5.11)
RDW: 16.9 % — ABNORMAL HIGH (ref 11.5–15.5)
WBC: 7.8 10*3/uL (ref 4.0–10.5)

## 2023-08-13 LAB — HEPATIC FUNCTION PANEL
ALT: 13 U/L (ref 0–35)
AST: 12 U/L (ref 0–37)
Albumin: 4 g/dL (ref 3.5–5.2)
Alkaline Phosphatase: 55 U/L (ref 39–117)
Bilirubin, Direct: 0 mg/dL (ref 0.0–0.3)
Total Bilirubin: 0.4 mg/dL (ref 0.2–1.2)
Total Protein: 7.9 g/dL (ref 6.0–8.3)

## 2023-08-13 LAB — VITAMIN D 25 HYDROXY (VIT D DEFICIENCY, FRACTURES): VITD: 51.77 ng/mL (ref 30.00–100.00)

## 2023-08-13 LAB — HEMOGLOBIN A1C: Hgb A1c MFr Bld: 6.7 % — ABNORMAL HIGH (ref 4.6–6.5)

## 2023-08-13 LAB — TSH: TSH: 2.15 u[IU]/mL (ref 0.35–5.50)

## 2023-08-13 NOTE — Assessment & Plan Note (Signed)
Pt's PE WNL w/ exception of BMI.  UTD on pap, Tdap, flu.  # provided for her to schedule mammogram.  Check labs.  Anticipatory guidance provided.

## 2023-08-13 NOTE — Patient Instructions (Addendum)
Follow up in 6 months to recheck sugar and blood pressure We'll notify you of your lab results and make any changes if needed Call [phone] to schedule mammogram Continue to work on healthy diet and regular exercise- you're doing great!!! Call with any questions or concerns Stay Safe!  Stay Healthy! Happy Holidays!!

## 2023-08-13 NOTE — Assessment & Plan Note (Signed)
Chronic problem.  On Ozempic and Metformin w/o difficulty.  UTD on microalbumin.  Eye exam scheduled.  Foot exam done today.  Check labs and adjust meds prn.

## 2023-08-13 NOTE — Progress Notes (Signed)
   Subjective:    Patient ID: Sarah Faulkner, female    DOB: 1980-10-27, 42 y.o.   MRN: [id number]  HPI CPE- UTD on pap, Tdap, flu, microalbumin.  Due for foot exam, eye exam (scheduled), mammo.  Is doing 30 day exercise challenge  Patient Care Team    Relationship Specialty Notifications Start End  Sheliah Hatch, MD PCP - General Family Medicine  12/16/12    Comment: Merged    Health Maintenance  Topic Date Due   OPHTHALMOLOGY EXAM  08/08/2023   HEMOGLOBIN A1C  08/09/2023   Diabetic kidney evaluation - eGFR measurement  02/06/2024   Diabetic kidney evaluation - Urine ACR  02/06/2024   FOOT EXAM  08/12/2024   Cervical Cancer Screening (HPV/Pap Cotest)  10/26/2025   DTaP/Tdap/Td (3 - Td or Tdap) 09/14/2031   INFLUENZA VACCINE  Completed   COVID-19 Vaccine  Completed   Hepatitis C Screening  Completed   HIV Screening  Completed   HPV VACCINES  Aged Out      Review of Systems Patient reports no vision/ hearing changes, adenopathy,fever,  persistant/recurrent hoarseness , swallowing issues, chest pain, palpitations, edema, persistant/recurrent cough, hemoptysis, dyspnea (rest/exertional/paroxysmal nocturnal), gastrointestinal bleeding (melena, rectal bleeding), abdominal pain, significant heartburn, bowel changes, GU symptoms (dysuria, hematuria, incontinence), Gyn symptoms (abnormal  bleeding, pain),  syncope, focal weakness, memory loss, numbness & tingling, skin/hair/nail changes, abnormal bruising or bleeding, anxiety, or depression.   + 12 lb weight loss    Objective:   Physical Exam General Appearance:    Alert, cooperative, no distress, appears stated age, obese  Head:    Normocephalic, without obvious abnormality, atraumatic  Eyes:    PERRL, conjunctiva/corneas clear, EOM's intact both eyes  Ears:    Normal TM's and external ear canals, both ears  Nose:   Nares normal, septum midline, mucosa normal, no drainage    or sinus tenderness  Throat:   Lips, mucosa, and  tongue normal; teeth and gums normal  Neck:   Supple, symmetrical, trachea midline, no adenopathy;    Thyroid: no enlargement/tenderness/nodules  Back:     Symmetric, no curvature, ROM normal, no CVA tenderness  Lungs:     Clear to auscultation bilaterally, respirations unlabored  Chest Wall:    No tenderness or deformity   Heart:    Regular rate and rhythm, S1 and S2 normal, no murmur, rub   or gallop  Breast Exam:    Deferred to GYN  Abdomen:     Soft, non-tender, bowel sounds active all four quadrants,    no masses, no organomegaly  Genitalia:    Deferred to GYN  Rectal:    Extremities:   Extremities normal, atraumatic, no cyanosis or edema  Pulses:   2+ and symmetric all extremities  Skin:   Skin color, texture, turgor normal, no rashes or lesions  Lymph nodes:   Cervical, supraclavicular, and axillary nodes normal  Neurologic:   CNII-XII intact, normal strength, sensation and reflexes    throughout          Assessment & Plan:

## 2023-08-14 ENCOUNTER — Telehealth: Payer: Self-pay

## 2023-08-14 NOTE — Telephone Encounter (Signed)
Noted  

## 2023-08-14 NOTE — Telephone Encounter (Signed)
Dexcom was previously denied. Please see telephone encounter on 07/26/23.

## 2023-08-14 NOTE — Telephone Encounter (Signed)
Sent to PA Team.

## 2023-08-28 ENCOUNTER — Telehealth: Payer: BC Managed Care – PPO | Admitting: Family Medicine

## 2023-08-28 DIAGNOSIS — M5441 Lumbago with sciatica, right side: Secondary | ICD-10-CM | POA: Diagnosis not present

## 2023-08-28 MED ORDER — CYCLOBENZAPRINE HCL 10 MG PO TABS: 10.0000 mg | ORAL_TABLET | Freq: Three times a day (TID) | ORAL | 0 refills | Status: DC | PRN

## 2023-08-28 NOTE — Progress Notes (Signed)

## 2023-08-30 ENCOUNTER — Encounter: Payer: Self-pay | Admitting: Family Medicine

## 2023-09-12 ENCOUNTER — Ambulatory Visit: Payer: BC Managed Care – PPO | Admitting: Family Medicine

## 2023-10-06 ENCOUNTER — Other Ambulatory Visit: Payer: Self-pay | Admitting: Family Medicine

## 2023-10-06 DIAGNOSIS — E119 Type 2 diabetes mellitus without complications: Secondary | ICD-10-CM

## 2023-10-14 ENCOUNTER — Ambulatory Visit: Payer: BC Managed Care – PPO | Admitting: Family Medicine

## 2023-10-24 ENCOUNTER — Other Ambulatory Visit (HOSPITAL_COMMUNITY): Payer: Self-pay

## 2023-10-24 ENCOUNTER — Telehealth: Payer: Self-pay

## 2023-10-24 NOTE — Telephone Encounter (Signed)
Pharmacy Patient Advocate Encounter  Received notification from [facility] that Prior Authorization for Dexcom G7 Sensor has been APPROVED from 10/24/23 to 10/23/24. Unable to obtain price due to refill too soon rejection, last fill date 10/24/23 next available fill date02/22/25   PA #/Case ID/Reference #: [id number]

## 2023-10-24 NOTE — Telephone Encounter (Signed)
Pharmacy Patient Advocate Encounter   Received notification from  [facility] that prior authorization for Dexcom G7 Sensor is required/requested.   Insurance verification completed.   The patient is insured through [employer] .   Per test claim: PA required; PA submitted to above mentioned insurance via CoverMyMeds Key/confirmation #/EOC [id number] Status is pending

## 2023-11-04 ENCOUNTER — Ambulatory Visit
Admission: RE | Admit: 2023-11-04 | Discharge: 2023-11-04 | Disposition: A | Discharge status: Home / Self Care | Payer: BC Managed Care – PPO | Source: Ambulatory Visit

## 2023-11-04 ENCOUNTER — Other Ambulatory Visit: Payer: Self-pay | Admitting: Family Medicine

## 2023-11-04 ENCOUNTER — Encounter: Payer: Self-pay | Admitting: Family Medicine

## 2023-11-04 DIAGNOSIS — E119 Type 2 diabetes mellitus without complications: Secondary | ICD-10-CM

## 2023-11-04 DIAGNOSIS — Z1231 Encounter for screening mammogram for malignant neoplasm of breast: Secondary | ICD-10-CM

## 2023-11-05 MED ORDER — VALACYCLOVIR HCL 1 G PO TABS: ORAL_TABLET | ORAL | 3 refills | Status: AC

## 2023-11-05 NOTE — Addendum Note (Signed)
Addended by: Sheliah Hatch on: 11/05/2023 07:59 AM   Modules accepted: Orders

## 2023-12-03 ENCOUNTER — Other Ambulatory Visit: Payer: Self-pay | Admitting: Family Medicine

## 2023-12-03 DIAGNOSIS — E119 Type 2 diabetes mellitus without complications: Secondary | ICD-10-CM

## 2023-12-31 ENCOUNTER — Encounter: Payer: Self-pay | Admitting: Family Medicine

## 2024-01-01 MED ORDER — ALPRAZOLAM 0.5 MG PO TABS: 0.5000 mg | ORAL_TABLET | Freq: Two times a day (BID) | ORAL | 1 refills | Status: DC | PRN

## 2024-01-01 NOTE — Telephone Encounter (Signed)
 Pt has victual appt 01/02/2024

## 2024-01-02 ENCOUNTER — Telehealth: Admitting: Family Medicine

## 2024-01-02 ENCOUNTER — Encounter: Payer: Self-pay | Admitting: Family Medicine

## 2024-01-02 VITALS — Ht 64.0 in | Wt 351.0 lb

## 2024-01-02 DIAGNOSIS — F419 Anxiety disorder, unspecified: Secondary | ICD-10-CM

## 2024-01-02 DIAGNOSIS — F32A Depression, unspecified: Secondary | ICD-10-CM

## 2024-01-02 MED ORDER — BUPROPION HCL 75 MG PO TABS: 75.0000 mg | ORAL_TABLET | Freq: Two times a day (BID) | ORAL | 1 refills | Status: DC

## 2024-01-02 NOTE — Progress Notes (Signed)
   Virtual Visit via Video   I connected with patient on 01/02/24 at  2:00 PM EDT by a video enabled telemedicine application and verified that I am speaking with the correct person using two identifiers.  Location patient: Home Location provider: [facility] Persons participating in the virtual visit: Patient, Provider, CMA Lynnie Saucier H)  I discussed the limitations of evaluation and management by telemedicine and the availability of in person appointments. The patient expressed understanding and agreed to proceed.  Subjective:   HPI:   Anxiety- pt reports increased family stress.  Thinks she's 'having a harder time than I should be'.  Brother got arrested recently, grandmother is ill.  Previously was on Sertraline but did not like the side effects.    ROS:   See pertinent positives and negatives per HPI.  Patient Active Problem List   Diagnosis Date Noted   Atypical chest pain 10/31/2021   Palpitations 10/31/2021   Snoring 10/31/2021   Fibroids 03/09/2021   Hypertension    Anxiety and depression 01/23/2019   Family history of breast cancer in mother 05/23/2017   Vitamin D deficiency 05/23/2017   Chronic iron deficiency anemia 07/21/2015   Menorrhagia 07/21/2015   Radicular low back pain 07/15/2015   Physical exam 09/29/2013   Diabetes mellitus type II, controlled, with no complications (HCC) 12/16/2012   Morbid obesity (HCC) 12/16/2012   Pain in right shoulder 12/16/2012    Social History   Tobacco Use   Smoking status: Never   Smokeless tobacco: Never  Substance Use Topics   Alcohol use: No    Current Outpatient Medications:    cyclobenzaprine (FLEXERIL) 10 MG tablet, Take 1 tablet (10 mg total) by mouth 3 (three) times daily as needed for muscle spasms., Disp: 30 tablet, Rfl: 0   metFORMIN (GLUCOPHAGE) 1000 MG tablet, TAKE 1 TABLET BY MOUTH TWICE A DAY WITH MEALS, Disp: 180 tablet, Rfl: 1   Semaglutide, 2 MG/DOSE, (OZEMPIC, 2 MG/DOSE,) 8 MG/3ML  SOPN, INJECT 2 MG AS DIRECTED ONCE A WEEK., Disp: 3 mL, Rfl: 3   valACYclovir (VALTREX) 1000 MG tablet, TAKE 1 TABLET(1000 MG) BY MOUTH DAILY, Disp: 90 tablet, Rfl: 3   valsartan-hydrochlorothiazide (DIOVAN-HCT) 160-12.5 MG tablet, TAKE 1 TABLET BY MOUTH EVERY DAY, Disp: 90 tablet, Rfl: 1   ALPRAZolam (XANAX) 0.5 MG tablet, Take 1 tablet (0.5 mg total) by mouth 2 (two) times daily as needed for anxiety. (Patient not taking: Reported on 01/02/2024), Disp: 30 tablet, Rfl: 1  No Known Allergies  Objective:   Ht 5\' 4"  (1.626 m)   Wt (!) 351 lb (159.2 kg)   BMI 60.25 kg/m  AAOx3, NAD NCAT, EOMI No obvious CN deficits Coloring WNL Pt is able to speak clearly, coherently without shortness of breath or increased work of breathing.  Thought process is linear.  Mood is appropriate.   Assessment and Plan:   Anxiety/depression- deteriorated.  Pt admits to increased family and work stress recently.  I refilled her Alprazolam yesterday for her to use prn but after our discussion today, will start low dose Wellbutrin and monitor closely for improvement. Pt expressed understanding and is in agreement w/ plan.    Laymon Priest, MD 01/02/2024

## 2024-01-25 ENCOUNTER — Other Ambulatory Visit: Payer: Self-pay | Admitting: Family Medicine

## 2024-01-26 ENCOUNTER — Other Ambulatory Visit: Payer: Self-pay | Admitting: Family Medicine

## 2024-01-26 DIAGNOSIS — E119 Type 2 diabetes mellitus without complications: Secondary | ICD-10-CM

## 2024-02-01 ENCOUNTER — Other Ambulatory Visit: Payer: Self-pay | Admitting: Family Medicine

## 2024-02-10 ENCOUNTER — Ambulatory Visit: Payer: BC Managed Care – PPO | Admitting: Family Medicine

## 2024-02-14 ENCOUNTER — Encounter: Payer: Self-pay | Admitting: Family Medicine

## 2024-02-14 ENCOUNTER — Ambulatory Visit: Admitting: Family Medicine

## 2024-02-14 VITALS — BP 130/86 | HR 74 | Temp 97.8°F | Ht 64.0 in | Wt 346.6 lb

## 2024-02-14 DIAGNOSIS — I10 Essential (primary) hypertension: Secondary | ICD-10-CM | POA: Diagnosis not present

## 2024-02-14 DIAGNOSIS — Z23 Encounter for immunization: Secondary | ICD-10-CM

## 2024-02-14 DIAGNOSIS — E119 Type 2 diabetes mellitus without complications: Secondary | ICD-10-CM

## 2024-02-14 DIAGNOSIS — Z7984 Long term (current) use of oral hypoglycemic drugs: Secondary | ICD-10-CM

## 2024-02-14 DIAGNOSIS — Z7985 Long-term (current) use of injectable non-insulin antidiabetic drugs: Secondary | ICD-10-CM

## 2024-02-14 LAB — HEPATIC FUNCTION PANEL
ALT: 11 U/L (ref 0–35)
AST: 13 U/L (ref 0–37)
Albumin: 4 g/dL (ref 3.5–5.2)
Alkaline Phosphatase: 59 U/L (ref 39–117)
Bilirubin, Direct: 0.1 mg/dL (ref 0.0–0.3)
Total Bilirubin: 0.4 mg/dL (ref 0.2–1.2)
Total Protein: 8 g/dL (ref 6.0–8.3)

## 2024-02-14 LAB — CBC WITH DIFFERENTIAL/PLATELET
Basophils Absolute: 0 10*3/uL (ref 0.0–0.1)
Basophils Relative: 0.6 % (ref 0.0–3.0)
Eosinophils Absolute: 0.1 10*3/uL (ref 0.0–0.7)
Eosinophils Relative: 1.3 % (ref 0.0–5.0)
HCT: 35.8 % — ABNORMAL LOW (ref 36.0–46.0)
Hemoglobin: 11.5 g/dL — ABNORMAL LOW (ref 12.0–15.0)
Lymphocytes Relative: 32.5 % (ref 12.0–46.0)
Lymphs Abs: 2.2 10*3/uL (ref 0.7–4.0)
MCHC: 32.2 g/dL (ref 30.0–36.0)
MCV: 71.3 fl — ABNORMAL LOW (ref 78.0–100.0)
Monocytes Absolute: 0.5 10*3/uL (ref 0.1–1.0)
Monocytes Relative: 6.7 % (ref 3.0–12.0)
Neutro Abs: 4 10*3/uL (ref 1.4–7.7)
Neutrophils Relative %: 58.9 % (ref 43.0–77.0)
Platelets: 432 10*3/uL — ABNORMAL HIGH (ref 150.0–400.0)
RBC: 5.02 Mil/uL (ref 3.87–5.11)
RDW: 16.4 % — ABNORMAL HIGH (ref 11.5–15.5)
WBC: 6.8 10*3/uL (ref 4.0–10.5)

## 2024-02-14 LAB — BASIC METABOLIC PANEL WITH GFR
BUN: 13 mg/dL (ref 6–23)
CO2: 27 meq/L (ref 19–32)
Calcium: 9.8 mg/dL (ref 8.4–10.5)
Chloride: 99 meq/L (ref 96–112)
Creatinine, Ser: 0.86 mg/dL (ref 0.40–1.20)
GFR: 83.14 mL/min (ref >60.00)
Glucose, Bld: 88 mg/dL (ref 70–99)
Potassium: 4 meq/L (ref 3.5–5.1)
Sodium: 134 meq/L — ABNORMAL LOW (ref 135–145)

## 2024-02-14 LAB — LIPID PANEL
Cholesterol: 119 mg/dL (ref 0–200)
HDL: 52.3 mg/dL (ref >39.00)
LDL Cholesterol: 58 mg/dL (ref 0–99)
NonHDL: 66.63
Total CHOL/HDL Ratio: 2
Triglycerides: 42 mg/dL (ref 0.0–149.0)
VLDL: 8.4 mg/dL (ref 0.0–40.0)

## 2024-02-14 LAB — MICROALBUMIN / CREATININE URINE RATIO
Creatinine,U: 160.8 mg/dL
Microalb Creat Ratio: UNDETERMINED mg/g (ref 0.0–30.0)
Microalb, Ur: <0.7 mg/dL

## 2024-02-14 LAB — HEMOGLOBIN A1C: Hgb A1c MFr Bld: 6.5 % (ref 4.6–6.5)

## 2024-02-14 LAB — TSH: TSH: 2.43 u[IU]/mL (ref 0.35–5.50)

## 2024-02-14 NOTE — Assessment & Plan Note (Signed)
 Chronic problem.  On Metformin  1000mg  BID and Ozempic  2mg  weekly.  Last A1C 6.7%  UTD on eye exam, foot exam.  Microalbumin ordered.  Check labs.  Adjust meds prn

## 2024-02-14 NOTE — Assessment & Plan Note (Signed)
 Ongoing issue.  Down 5 lbs since last visit.  BMI now 59.49.  Currently walking regularly and strength training.  Applauded her efforts.  Will continue to follow

## 2024-02-14 NOTE — Progress Notes (Signed)
   Subjective:    Patient ID: Sarah Faulkner, female    DOB: 08-11-1981, 43 y.o.   MRN: [id number]  HPI DM- chronic problem.  On Metformin  1000mg  BID, Ozempic  2mg  weekly.  Last A1C 6.7%  UTD on eye exam, foot exam..  Due for microalbumin.  No abd pain, N/V.  No numbness/tingling of hands/feet.  Denies symptomatic lows.  HTN- chronic problem.  On Valsartan  hydrochlorothiazide  160/12.5mg  daily.  BP elevated today.  No CP, SOB, HA's, visual changes, edema.  Obesity- ongoing issue for pt.  Down 5 lbs since CPE.  BMI 59.49  Continues to exercise regularly- walking 30 minutes/day, joined gym and strength training 1-2x/week.   Review of Systems For ROS see HPI     Objective:   Physical Exam Vitals reviewed.  Constitutional:      General: She is not in acute distress.    Appearance: Normal appearance. She is well-developed. She is obese. She is not ill-appearing.  HENT:     Head: Normocephalic and atraumatic.  Eyes:     Conjunctiva/sclera: Conjunctivae normal.     Pupils: Pupils are equal, round, and reactive to light.  Neck:     Thyroid : No thyromegaly.  Cardiovascular:     Rate and Rhythm: Normal rate and regular rhythm.     Pulses: Normal pulses.     Heart sounds: Normal heart sounds. No murmur heard. Pulmonary:     Effort: Pulmonary effort is normal. No respiratory distress.     Breath sounds: Normal breath sounds.  Abdominal:     General: There is no distension.     Palpations: Abdomen is soft.     Tenderness: There is no abdominal tenderness.  Musculoskeletal:     Cervical back: Normal range of motion and neck supple.     Right lower leg: No edema.     Left lower leg: No edema.  Lymphadenopathy:     Cervical: No cervical adenopathy.  Skin:    General: Skin is warm and dry.  Neurological:     General: No focal deficit present.     Mental Status: She is alert and oriented to person, place, and time.  Psychiatric:        Mood and Affect: Mood normal.         Behavior: Behavior normal.        Thought Content: Thought content normal.           Assessment & Plan:

## 2024-02-14 NOTE — Patient Instructions (Signed)
Schedule your complete physical in 6 months We'll notify you of your lab results and make any changes if needed Continue to work on healthy diet and regular exercise- you're doing great! Call with any questions or concerns Stay Safe!  Stay Healthy! Have a great summer!! 

## 2024-02-14 NOTE — Assessment & Plan Note (Signed)
 Chronic problem.  On Valsartan  hydrochlorothiazide  160/12.5mg  daily.  Currently asymptomatic.  Check labs due to ARB and diuretic use but no anticipated med changes.  Will follow.

## 2024-02-18 ENCOUNTER — Ambulatory Visit: Payer: Self-pay | Admitting: Family Medicine

## 2024-02-18 NOTE — Telephone Encounter (Signed)
 Patient wanted to know if GFR was a concern?

## 2024-02-24 ENCOUNTER — Other Ambulatory Visit: Payer: Self-pay | Admitting: Family Medicine

## 2024-03-23 DIAGNOSIS — M7541 Impingement syndrome of right shoulder: Secondary | ICD-10-CM | POA: Diagnosis not present

## 2024-03-23 DIAGNOSIS — M7542 Impingement syndrome of left shoulder: Secondary | ICD-10-CM | POA: Diagnosis not present

## 2024-05-11 LAB — HM DIABETES EYE EXAM

## 2024-05-17 ENCOUNTER — Other Ambulatory Visit: Payer: Self-pay | Admitting: Family Medicine

## 2024-05-17 DIAGNOSIS — E119 Type 2 diabetes mellitus without complications: Secondary | ICD-10-CM

## 2024-06-07 ENCOUNTER — Other Ambulatory Visit: Payer: Self-pay | Admitting: Family Medicine

## 2024-06-07 DIAGNOSIS — E119 Type 2 diabetes mellitus without complications: Secondary | ICD-10-CM

## 2024-06-08 ENCOUNTER — Telehealth: Admitting: Family Medicine

## 2024-06-08 DIAGNOSIS — M545 Low back pain, unspecified: Secondary | ICD-10-CM

## 2024-06-08 MED ORDER — CYCLOBENZAPRINE HCL 10 MG PO TABS: 10.0000 mg | ORAL_TABLET | Freq: Three times a day (TID) | ORAL | 0 refills | Status: DC | PRN

## 2024-06-08 MED ORDER — NAPROXEN 500 MG PO TABS: 500.0000 mg | ORAL_TABLET | Freq: Two times a day (BID) | ORAL | 0 refills | Status: DC

## 2024-06-08 NOTE — Patient Instructions (Addendum)
 Sarah Faulkner, thank you for joining Sarah CHRISTELLA Barefoot, NP for today's virtual visit.  While this provider is not your primary care provider (PCP), if your PCP is located in our provider database this encounter information will be shared with them immediately following your visit.   A [facility] MyChart account gives you access to today's visit and all your visits, tests, and labs performed at [facility]  click here if you don't have a [facility] MyChart account or go to [email or web address]  Consent: (Patient) Sarah Faulkner provided verbal consent for this virtual visit at the beginning of the encounter.  Current Medications:  Current Outpatient Medications:    cyclobenzaprine  (FLEXERIL ) 10 MG tablet, Take 1 tablet (10 mg total) by mouth 3 (three) times daily as needed for muscle spasms., Disp: 30 tablet, Rfl: 0   naproxen  (NAPROSYN ) 500 MG tablet, Take 1 tablet (500 mg total) by mouth 2 (two) times daily with a meal., Disp: 30 tablet, Rfl: 0   buPROPion  (WELLBUTRIN ) 75 MG tablet, TAKE 1 TABLET BY MOUTH TWICE A DAY, Disp: 90 tablet, Rfl: 1   metFORMIN  (GLUCOPHAGE ) 1000 MG tablet, TAKE 1 TABLET BY MOUTH TWICE A DAY WITH MEALS, Disp: 180 tablet, Rfl: 1   Semaglutide , 2 MG/DOSE, (OZEMPIC , 2 MG/DOSE,) 8 MG/3ML SOPN, INJECT 2 MG AS DIRECTED ONCE A WEEK., Disp: 3 mL, Rfl: 3   valACYclovir  (VALTREX ) 1000 MG tablet, TAKE 1 TABLET(1000 MG) BY MOUTH DAILY, Disp: 90 tablet, Rfl: 3   valsartan -hydrochlorothiazide  (DIOVAN -HCT) 160-12.5 MG tablet, TAKE 1 TABLET BY MOUTH EVERY DAY, Disp: 90 tablet, Rfl: 1   Medications ordered in this encounter:  Meds ordered this encounter  Medications   cyclobenzaprine  (FLEXERIL ) 10 MG tablet    Sig: Take 1 tablet (10 mg total) by mouth 3 (three) times daily as needed for muscle spasms.    Dispense:  30 tablet    Refill:  0    Supervising Provider:   LAMPTEY, PHILIP O [[id number]]   naproxen  (NAPROSYN ) 500 MG tablet    Sig: Take 1 tablet  (500 mg total) by mouth 2 (two) times daily with a meal.    Dispense:  30 tablet    Refill:  0    Supervising Provider:   BLAISE ALEENE KIDD [[id number]]     *If you need refills on other medications prior to your next appointment, please contact your pharmacy*  Follow-Up: Call back or seek an in-person evaluation if the symptoms worsen or if the condition fails to improve as anticipated.   Virtual Care [phone]  Other Instructions  -use medications as discussed -heating pad as needed -gentle stretches -walking -follow up if not improving or worsening  Acute Back Pain, Adult Acute back pain is sudden and usually short-lived. It is often caused by an injury to the muscles and tissues in the back. The injury may result from: A muscle, tendon, or ligament getting overstretched or torn. Ligaments are tissues that connect bones to each other. Lifting something improperly can cause a back strain. Wear and tear (degeneration) of the spinal disks. Spinal disks are circular tissue that provide cushioning between the bones of the spine (vertebrae). Twisting motions, such as while playing sports or doing yard work. A hit to the back. Arthritis. You may have a physical exam, lab tests, and imaging tests to find the cause of your pain. Acute back pain usually goes away with rest and home care. Follow these instructions at home: Managing pain, stiffness, and swelling  Take over-the-counter and prescription medicines only as told by your health care provider. Treatment may include medicines for pain and inflammation that are taken by mouth or applied to the skin, or muscle relaxants. Your health care provider may recommend applying ice during the first 24-48 hours after your pain starts. To do this: Put ice in a plastic bag. Place a towel between your skin and the bag. Leave the ice on for 20 minutes, 2-3 times a day. Remove the ice if your skin turns bright red. This is very  important. If you cannot feel pain, heat, or cold, you have a greater risk of damage to the area. If directed, apply heat to the affected area as often as told by your health care provider. Use the heat source that your health care provider recommends, such as a moist heat pack or a heating pad. Place a towel between your skin and the heat source. Leave the heat on for 20-30 minutes. Remove the heat if your skin turns bright red. This is especially important if you are unable to feel pain, heat, or cold. You have a greater risk of getting burned. Activity  Do not stay in bed. Staying in bed for more than 1-2 days can delay your recovery. Sit up and stand up straight. Avoid leaning forward when you sit or hunching over when you stand. If you work at a desk, sit close to it so you do not need to lean over. Keep your chin tucked in. Keep your neck drawn back, and keep your elbows bent at a 90-degree angle (right angle). Sit high and close to the steering wheel when you drive. Add lower back (lumbar) support to your car seat, if needed. Take short walks on even surfaces as soon as you are able. Try to increase the length of time you walk each day. Do not sit, drive, or stand in one place for more than 30 minutes at a time. Sitting or standing for long periods of time can put stress on your back. Do not drive or use heavy machinery while taking prescription pain medicine. Use proper lifting techniques. When you bend and lift, use positions that put less stress on your back: [place] your knees. Keep the load close to your body. Avoid twisting. Exercise regularly as told by your health care provider. Exercising helps your back heal faster and helps prevent back injuries by keeping muscles strong and flexible. Work with a physical therapist to make a safe exercise program, as recommended by your health care provider. Do any exercises as told by your physical therapist. Lifestyle Maintain a healthy weight.  Extra weight puts stress on your back and makes it difficult to have good posture. Avoid activities or situations that make you feel anxious or stressed. Stress and anxiety increase muscle tension and can make back pain worse. Learn ways to manage anxiety and stress, such as through exercise. General instructions Sleep on a firm mattress in a comfortable position. Try lying on your side with your knees slightly bent. If you lie on your back, put a pillow under your knees. Keep your head and neck in a straight line with your spine (neutral position) when using electronic equipment like smartphones or pads. To do this: Raise your smartphone or pad to look at it instead of bending your head or neck to look down. Put the smartphone or pad at the level of your face while looking at the screen. Follow your treatment plan as told by your  health care provider. This may include: Cognitive or behavioral therapy. Acupuncture or massage therapy. Meditation or yoga. Contact a health care provider if: You have pain that is not relieved with rest or medicine. You have increasing pain going down into your legs or buttocks. Your pain does not improve after 2 weeks. You have pain at night. You lose weight without trying. You have a fever or chills. You develop nausea or vomiting. You develop abdominal pain. Get help right away if: You develop new bowel or bladder control problems. You have unusual weakness or numbness in your arms or legs. You feel faint. These symptoms may represent a serious problem that is an emergency. Do not wait to see if the symptoms will go away. Get medical help right away. Call your local emergency services (911 in the [place] Do not drive yourself to the hospital. Summary Acute back pain is sudden and usually short-lived. Use proper lifting techniques. When you bend and lift, use positions that put less stress on your back. Take over-the-counter and prescription medicines only as  told by your health care provider, and apply heat or ice as told. This information is not intended to replace advice given to you by your health care provider. Make sure you discuss any questions you have with your health care provider. Document Revised: 12/02/2020 Document Reviewed: 12/02/2020 Elsevier Patient Education  2024 Else[employer]   If you have been instructed to have an in-person evaluation today at a local Urgent Care facility, please use the link below. It will take you to a list of all of our available [facility], including address, phone number and hours of operation. Please do not delay care.  [facility]  If you or a family member do not have a primary care provider, use the link below to schedule a visit and establish care. When you choose a [facility] primary care physician or advanced practice provider, you gain a long-term partner in health. Find a Primary Care Provider  Learn more about [facility] in-office and virtual care options:  - Get Care Now

## 2024-06-08 NOTE — Progress Notes (Signed)
 Virtual Visit Consent   Sarah Faulkner, you are scheduled for a virtual visit with a Sarah Faulkner provider today. Just as with appointments in the office, your consent must be obtained to participate. Your consent will be active for this visit and any virtual visit you may have with one of our providers in the next 365 days. If you have a MyChart account, a copy of this consent can be sent to you electronically.  As this is a virtual visit, video technology does not allow for your provider to perform a traditional examination. This may limit your provider's ability to fully assess your condition. If your provider identifies any concerns that need to be evaluated in person or the need to arrange testing (such as labs, EKG, etc.), we will make arrangements to do so. Although advances in technology are sophisticated, we cannot ensure that it will always work on either your end or our end. If the connection with a video visit is poor, the visit may have to be switched to a telephone visit. With either a video or telephone visit, we are not always able to ensure that we have a secure connection.  By engaging in this virtual visit, you consent to the provision of healthcare and authorize for your insurance to be billed (if applicable) for the services provided during this visit. Depending on your insurance coverage, you may receive a charge related to this service.  I need to obtain your verbal consent now. Are you willing to proceed with your visit today? Sarah Faulkner has provided verbal consent on 06/08/2024 for a virtual visit (video or telephone). Sarah CHRISTELLA Barefoot, NP  Date: 06/08/2024 2:59 PM   Virtual Visit via Video Note   I, Sarah Faulkner, connected with  Sarah Faulkner  ([id number], [birth year]) on 06/08/24 at  3:00 PM EDT by a video-enabled telemedicine application and verified that I am speaking with the correct person using two identifiers.  Location: Patient: Virtual Visit  Location Patient: Home Provider: Virtual Visit Location Provider: Home Office   I discussed the limitations of evaluation and management by telemedicine and the availability of in person appointments. The patient expressed understanding and agreed to proceed.    History of Present Illness: [name] is a 43 y.o. who identifies as a female who was assigned female at birth, and is being seen today for back pain  New onset of back pain for initial onset was Aug 26 th first day of discomfort. But started getting worse and constant 3-4 days ago. Not injury or trauma related    Unable to get to sleep. Has tried different positions. Has been travelling a lot- and sitting in cramped up spaces. Traveled to [place] and was walking around city but not wearing the best shoes.  This pain is located in the lower back without radiation to legs. Pain score today is rated 5/10. At its worse the pain is a level 10/10. It has started to disturb sleep and limiting movement. It is relieved by not moving/ crunching into a ball/fetal position  It is aggravated by bending, turning, moving Modifying factors have included Advil  back and pain meds and Robaxin (had from a shoulder injury) as well  There is no associated lower extremity numbness or weakness. There is no associated incontinence of stool or urine.   Problems:  Patient Active Problem List   Diagnosis Date Noted   Atypical chest pain 10/31/2021   Palpitations 10/31/2021   Snoring 10/31/2021  Fibroids 03/09/2021   Hypertension    Anxiety and depression 01/23/2019   Family history of breast cancer in mother 05/23/2017   Vitamin D  deficiency 05/23/2017   Chronic iron deficiency anemia 07/21/2015   Menorrhagia 07/21/2015   Radicular low back pain 07/15/2015   Physical exam 09/29/2013   Diabetes mellitus type II, controlled, with no complications (HCC) 12/16/2012   Morbid obesity (HCC) 12/16/2012   Pain in right shoulder 12/16/2012     Allergies: No Known Allergies Medications:  Current Outpatient Medications:    buPROPion  (WELLBUTRIN ) 75 MG tablet, TAKE 1 TABLET BY MOUTH TWICE A DAY, Disp: 90 tablet, Rfl: 1   metFORMIN  (GLUCOPHAGE ) 1000 MG tablet, TAKE 1 TABLET BY MOUTH TWICE A DAY WITH MEALS, Disp: 180 tablet, Rfl: 1   Semaglutide , 2 MG/DOSE, (OZEMPIC , 2 MG/DOSE,) 8 MG/3ML SOPN, INJECT 2 MG AS DIRECTED ONCE A WEEK., Disp: 3 mL, Rfl: 3   valACYclovir  (VALTREX ) 1000 MG tablet, TAKE 1 TABLET(1000 MG) BY MOUTH DAILY, Disp: 90 tablet, Rfl: 3   valsartan -hydrochlorothiazide  (DIOVAN -HCT) 160-12.5 MG tablet, TAKE 1 TABLET BY MOUTH EVERY DAY, Disp: 90 tablet, Rfl: 1  Observations/Objective: Patient is well-developed, well-nourished in no acute distress.  Resting comfortably  at home.  Head is normocephalic, atraumatic.  No labored breathing.  Speech is clear and coherent with logical content.  Patient is alert and oriented at baseline.    Assessment and Plan:  1. Acute midline low back pain without sciatica (Primary)  - cyclobenzaprine  (FLEXERIL ) 10 MG tablet; Take 1 tablet (10 mg total) by mouth 3 (three) times daily as needed for muscle spasms.  Dispense: 30 tablet; Refill: 0 - naproxen  (NAPROSYN ) 500 MG tablet; Take 1 tablet (500 mg total) by mouth 2 (two) times daily with a meal.  Dispense: 30 tablet; Refill: 0  -use medications as discussed -heating pad as needed -gentle stretches -walking -follow up if not improving or worsening   Reviewed side effects, risks and benefits of medication.    Patient acknowledged agreement and understanding of the plan.   Past Medical, Surgical, Social History, Allergies, and Medications have been Reviewed.    Follow Up Instructions: I discussed the assessment and treatment plan with the patient. The patient was provided an opportunity to ask questions and all were answered. The patient agreed with the plan and demonstrated an understanding of the instructions.  A copy of  instructions were sent to the patient via MyChart unless otherwise noted below.    The patient was advised to call back or seek an in-person evaluation if the symptoms worsen or if the condition fails to improve as anticipated.    Sarah CHRISTELLA Barefoot, NP

## 2024-08-11 ENCOUNTER — Other Ambulatory Visit: Payer: Self-pay | Admitting: Family Medicine

## 2024-08-11 MED ORDER — DEXCOM G7 SENSOR MISC: 3 refills | Status: AC

## 2024-08-11 NOTE — Addendum Note (Signed)
 Addended by: Miko Markwood E on: 08/11/2024 02:12 PM   Modules accepted: Orders

## 2024-08-11 NOTE — Telephone Encounter (Signed)
 Okay to refill? Patient started using sensor again

## 2024-08-19 ENCOUNTER — Encounter: Admitting: Family Medicine

## 2024-09-07 ENCOUNTER — Encounter: Admitting: Family Medicine

## 2024-09-15 ENCOUNTER — Other Ambulatory Visit: Payer: Self-pay | Admitting: Family Medicine

## 2024-09-15 ENCOUNTER — Encounter: Payer: Self-pay | Admitting: Family Medicine

## 2024-09-15 ENCOUNTER — Ambulatory Visit: Admitting: Family Medicine

## 2024-09-15 VITALS — BP 120/76 | HR 69 | Temp 98.4°F | Resp 15 | Ht 64.0 in | Wt 355.8 lb

## 2024-09-15 DIAGNOSIS — E119 Type 2 diabetes mellitus without complications: Secondary | ICD-10-CM | POA: Diagnosis not present

## 2024-09-15 DIAGNOSIS — E559 Vitamin D deficiency, unspecified: Secondary | ICD-10-CM

## 2024-09-15 DIAGNOSIS — Z0000 Encounter for general adult medical examination without abnormal findings: Secondary | ICD-10-CM | POA: Diagnosis not present

## 2024-09-15 LAB — BASIC METABOLIC PANEL WITH GFR
BUN: 9 mg/dL (ref 6–23)
CO2: 27 meq/L (ref 19–32)
Calcium: 8.8 mg/dL (ref 8.4–10.5)
Chloride: 102 meq/L (ref 96–112)
Creatinine, Ser: 0.78 mg/dL (ref 0.40–1.20)
GFR: 93.09 mL/min
Glucose, Bld: 92 mg/dL (ref 70–99)
Potassium: 4.5 meq/L (ref 3.5–5.1)
Sodium: 135 meq/L (ref 135–145)

## 2024-09-15 LAB — CBC WITH DIFFERENTIAL/PLATELET
Basophils Absolute: 0 K/uL (ref 0.0–0.1)
Basophils Relative: 0.5 % (ref 0.0–3.0)
Eosinophils Absolute: 0.1 K/uL (ref 0.0–0.7)
Eosinophils Relative: 1.9 % (ref 0.0–5.0)
HCT: 34.3 % — ABNORMAL LOW (ref 36.0–46.0)
Hemoglobin: 11.1 g/dL — ABNORMAL LOW (ref 12.0–15.0)
Lymphocytes Relative: 35.6 % (ref 12.0–46.0)
Lymphs Abs: 2.3 K/uL (ref 0.7–4.0)
MCHC: 32.2 g/dL (ref 30.0–36.0)
MCV: 73 fl — ABNORMAL LOW (ref 78.0–100.0)
Monocytes Absolute: 0.5 K/uL (ref 0.1–1.0)
Monocytes Relative: 8.3 % (ref 3.0–12.0)
Neutro Abs: 3.4 K/uL (ref 1.4–7.7)
Neutrophils Relative %: 53.7 % (ref 43.0–77.0)
Platelets: 440 K/uL — ABNORMAL HIGH (ref 150.0–400.0)
RBC: 4.7 Mil/uL (ref 3.87–5.11)
RDW: 16.9 % — ABNORMAL HIGH (ref 11.5–15.5)
WBC: 6.3 K/uL (ref 4.0–10.5)

## 2024-09-15 LAB — LIPID PANEL
Cholesterol: 112 mg/dL (ref 28–200)
HDL: 48.9 mg/dL
LDL Cholesterol: 55 mg/dL (ref 10–99)
NonHDL: 63.11
Total CHOL/HDL Ratio: 2
Triglycerides: 39 mg/dL (ref 10.0–149.0)
VLDL: 7.8 mg/dL (ref 0.0–40.0)

## 2024-09-15 LAB — TSH: TSH: 1.85 u[IU]/mL (ref 0.35–5.50)

## 2024-09-15 LAB — HEMOGLOBIN A1C: Hgb A1c MFr Bld: 6.4 % (ref 4.6–6.5)

## 2024-09-15 LAB — HEPATIC FUNCTION PANEL
ALT: 12 U/L (ref 3–35)
AST: 10 U/L (ref 5–37)
Albumin: 3.7 g/dL (ref 3.5–5.2)
Alkaline Phosphatase: 52 U/L (ref 39–117)
Bilirubin, Direct: 0.1 mg/dL (ref 0.1–0.3)
Total Bilirubin: 0.4 mg/dL (ref 0.2–1.2)
Total Protein: 7.2 g/dL (ref 6.0–8.3)

## 2024-09-15 LAB — VITAMIN D 25 HYDROXY (VIT D DEFICIENCY, FRACTURES): VITD: 33.01 ng/mL (ref 30.00–100.00)

## 2024-09-15 NOTE — Telephone Encounter (Signed)
 Patient is requesting a refill on the following medication  Okay to refill under your name?

## 2024-09-15 NOTE — Patient Instructions (Addendum)
 Follow up in 6 months to recheck sugar and blood pressure We'll notify you of your lab results and make any changes if needed Continue to work on healthy diet and regular exercise- you can do it! Call with any questions or concerns Stay Safe!  Stay Healthy! Happy Holidays!!!

## 2024-09-15 NOTE — Assessment & Plan Note (Signed)
Chronic problem.  UTD on eye exam, microalbumin.  Foot exam done today.  Check labs.  Adjust meds prn

## 2024-09-15 NOTE — Progress Notes (Signed)
" ° °  Subjective:    Patient ID: Sarah Faulkner, female    DOB: December 19, 1980, 43 y.o.   MRN: [id number]  HPI CPE- UTD on eye exam, pap, mammo, Tdap.  Due for foot exam.  Health Maintenance  Topic Date Due   Hepatitis B Vaccines 19-59 Average Risk (1 of 3 - 19+ 3-dose series) Never done   HPV VACCINES (1 - 3-dose SCDM series) Never done   HEMOGLOBIN A1C  08/16/2024   Diabetic kidney evaluation - eGFR measurement  02/13/2025   Diabetic kidney evaluation - Urine ACR  02/13/2025   OPHTHALMOLOGY EXAM  05/11/2025   FOOT EXAM  09/15/2025   Cervical Cancer Screening (HPV/Pap Cotest)  10/26/2025   Mammogram  11/03/2025   DTaP/Tdap/Td (3 - Td or Tdap) 09/14/2031   Pneumococcal Vaccine  Completed   Influenza Vaccine  Completed   COVID-19 Vaccine  Completed   Hepatitis C Screening  Completed   HIV Screening  Completed   Meningococcal B Vaccine  Aged Out    Patient Care Team    Relationship Specialty Notifications Start End  Mahlon Comer BRAVO, MD PCP - General Family Medicine  11/04/23   [facility]    08/13/23      Review of Systems Patient reports no vision/ hearing changes, adenopathy,fever, persistant/recurrent hoarseness , swallowing issues, chest pain, palpitations, edema, persistant/recurrent cough, hemoptysis, dyspnea (rest/exertional/paroxysmal nocturnal), gastrointestinal bleeding (melena, rectal bleeding), abdominal pain, significant heartburn, bowel changes, GU symptoms (dysuria, hematuria, incontinence), Gyn symptoms (abnormal  bleeding, pain),  syncope, focal weakness, memory loss, numbness & tingling, skin/hair/nail changes, abnormal bruising or bleeding, anxiety, or depression.   + 9 lb weight gain    Objective:   Physical Exam General Appearance:    Alert, cooperative, no distress, appears stated age, obese  Head:    Normocephalic, without obvious abnormality, atraumatic  Eyes:    PERRL, conjunctiva/corneas clear, EOM's intact both eyes  Ears:    Normal TM's  and external ear canals, both ears  Nose:   Nares normal, septum midline, mucosa normal, no drainage    or sinus tenderness  Throat:   Lips, mucosa, and tongue normal; teeth and gums normal  Neck:   Supple, symmetrical, trachea midline, no adenopathy;    Thyroid : no enlargement/tenderness/nodules  Back:     Symmetric, no curvature, ROM normal, no CVA tenderness  Lungs:     Clear to auscultation bilaterally, respirations unlabored  Chest Wall:    No tenderness or deformity   Heart:    Regular rate and rhythm, S1 and S2 normal, no murmur, rub   or gallop  Breast Exam:    Deferred to GYN  Abdomen:     Soft, non-tender, bowel sounds active all four quadrants,    no masses, no organomegaly  Genitalia:    Deferred to GYN  Rectal:    Extremities:   Extremities normal, atraumatic, no cyanosis or edema  Pulses:   2+ and symmetric all extremities  Skin:   Skin color, texture, turgor normal, no rashes or lesions  Lymph nodes:   Cervical, supraclavicular, and axillary nodes normal  Neurologic:   CNII-XII intact, normal strength, sensation and reflexes    throughout          Assessment & Plan:    "

## 2024-09-15 NOTE — Assessment & Plan Note (Signed)
Pt's PE WNL w/ exception of BMI.  UTD on pap, mammo, Tdap.  Check labs.  Anticipatory guidance provided.  

## 2024-09-18 ENCOUNTER — Ambulatory Visit: Payer: Self-pay | Admitting: Family Medicine

## 2024-09-27 ENCOUNTER — Other Ambulatory Visit: Payer: Self-pay | Admitting: Family Medicine

## 2024-10-16 ENCOUNTER — Encounter: Admitting: Family Medicine

## 2024-11-23 ENCOUNTER — Encounter: Admitting: Family Medicine

## 2025-03-16 ENCOUNTER — Ambulatory Visit: Admitting: Family Medicine
